# Patient Record
Sex: Male | Born: 1975 | Race: White | Hispanic: No | Marital: Married | Smoking: Never smoker
Health system: Southern US, Community
[De-identification: ages and names within clinical notes are randomized; demographics above are authoritative.]

## PROBLEM LIST (undated history)

## (undated) DIAGNOSIS — T7840XA Allergy, unspecified, initial encounter: Secondary | ICD-10-CM

## (undated) DIAGNOSIS — Z9889 Other specified postprocedural states: Secondary | ICD-10-CM

## (undated) DIAGNOSIS — K219 Gastro-esophageal reflux disease without esophagitis: Secondary | ICD-10-CM

## (undated) DIAGNOSIS — S43439A Superior glenoid labrum lesion of unspecified shoulder, initial encounter: Secondary | ICD-10-CM

## (undated) DIAGNOSIS — K2270 Barrett's esophagus without dysplasia: Secondary | ICD-10-CM

## (undated) DIAGNOSIS — R112 Nausea with vomiting, unspecified: Secondary | ICD-10-CM

## (undated) HISTORY — DX: Allergy, unspecified, initial encounter: T78.40XA

## (undated) HISTORY — DX: Barrett's esophagus without dysplasia: K22.70

## (undated) HISTORY — PX: KNEE ARTHROSCOPY: SUR90

---
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
## 2004-11-25 ENCOUNTER — Emergency Department (HOSPITAL_COMMUNITY): Admission: EM | Admit: 2004-11-25 | Discharge: 2004-11-25 | Payer: Self-pay | Admitting: *Deleted

## 2009-02-10 ENCOUNTER — Emergency Department (HOSPITAL_COMMUNITY): Admission: EM | Admit: 2009-02-10 | Discharge: 2009-02-11 | Payer: Self-pay | Admitting: Emergency Medicine

## 2010-04-02 ENCOUNTER — Ambulatory Visit (HOSPITAL_BASED_OUTPATIENT_CLINIC_OR_DEPARTMENT_OTHER): Admission: RE | Admit: 2010-04-02 | Discharge: 2010-04-02 | Payer: Self-pay | Admitting: Specialist

## 2011-01-14 LAB — POCT HEMOGLOBIN-HEMACUE: Hemoglobin: 15.6 g/dL (ref 13.0–17.0)

## 2012-02-11 ENCOUNTER — Other Ambulatory Visit: Payer: Self-pay | Admitting: Otolaryngology

## 2012-03-04 ENCOUNTER — Encounter (HOSPITAL_BASED_OUTPATIENT_CLINIC_OR_DEPARTMENT_OTHER): Payer: Self-pay | Admitting: *Deleted

## 2012-03-05 ENCOUNTER — Encounter (HOSPITAL_BASED_OUTPATIENT_CLINIC_OR_DEPARTMENT_OTHER): Payer: Self-pay | Admitting: *Deleted

## 2012-03-05 NOTE — Progress Notes (Signed)
To [facility] at 1100- Hg on arrival,NPO after mn-reviewed [facility] guidelines.

## 2012-03-09 ENCOUNTER — Encounter (HOSPITAL_BASED_OUTPATIENT_CLINIC_OR_DEPARTMENT_OTHER): Payer: Self-pay

## 2012-03-09 ENCOUNTER — Ambulatory Visit (HOSPITAL_BASED_OUTPATIENT_CLINIC_OR_DEPARTMENT_OTHER): Payer: BC Managed Care – PPO | Admitting: Anesthesiology

## 2012-03-09 ENCOUNTER — Encounter (HOSPITAL_BASED_OUTPATIENT_CLINIC_OR_DEPARTMENT_OTHER): Payer: Self-pay | Admitting: Anesthesiology

## 2012-03-09 ENCOUNTER — Encounter (HOSPITAL_BASED_OUTPATIENT_CLINIC_OR_DEPARTMENT_OTHER)
Admission: RE | Disposition: A | Discharge status: Home / Self Care | Payer: Self-pay | Source: Ambulatory Visit | Attending: Specialist

## 2012-03-09 ENCOUNTER — Ambulatory Visit (HOSPITAL_BASED_OUTPATIENT_CLINIC_OR_DEPARTMENT_OTHER)
Admission: RE | Admit: 2012-03-09 | Discharge: 2012-03-09 | Disposition: A | Discharge status: Home / Self Care | Payer: BC Managed Care – PPO | Source: Ambulatory Visit | Attending: Specialist | Admitting: Specialist

## 2012-03-09 DIAGNOSIS — S43429A Sprain of unspecified rotator cuff capsule, initial encounter: Secondary | ICD-10-CM | POA: Insufficient documentation

## 2012-03-09 DIAGNOSIS — K219 Gastro-esophageal reflux disease without esophagitis: Secondary | ICD-10-CM | POA: Insufficient documentation

## 2012-03-09 DIAGNOSIS — M25819 Other specified joint disorders, unspecified shoulder: Secondary | ICD-10-CM | POA: Insufficient documentation

## 2012-03-09 DIAGNOSIS — M75100 Unspecified rotator cuff tear or rupture of unspecified shoulder, not specified as traumatic: Secondary | ICD-10-CM

## 2012-03-09 DIAGNOSIS — X58XXXA Exposure to other specified factors, initial encounter: Secondary | ICD-10-CM | POA: Insufficient documentation

## 2012-03-09 DIAGNOSIS — M24119 Other articular cartilage disorders, unspecified shoulder: Secondary | ICD-10-CM | POA: Insufficient documentation

## 2012-03-09 HISTORY — PX: SHOULDER ARTHROSCOPY: SHX128

## 2012-03-09 HISTORY — DX: Gastro-esophageal reflux disease without esophagitis: K21.9

## 2012-03-09 HISTORY — DX: Nausea with vomiting, unspecified: R11.2

## 2012-03-09 HISTORY — DX: Other specified postprocedural states: Z98.890

## 2012-03-09 HISTORY — DX: Superior glenoid labrum lesion of unspecified shoulder, initial encounter: S43.439A

## 2012-03-09 SURGERY — ARTHROSCOPY, SHOULDER
Anesthesia: General | Site: Shoulder | Laterality: Left | Wound class: Clean

## 2012-03-09 MED ORDER — PROMETHAZINE HCL 25 MG/ML IJ SOLN: 6.2500 mg | INTRAMUSCULAR | Status: DC | PRN

## 2012-03-09 MED ORDER — GLYCOPYRROLATE 0.2 MG/ML IJ SOLN
INTRAMUSCULAR | Status: DC | PRN
  Administered 2012-03-09: 0.4 mg via INTRAVENOUS

## 2012-03-09 MED ORDER — LACTATED RINGERS IV SOLN: INTRAVENOUS | Status: DC

## 2012-03-09 MED ORDER — HYDROCODONE-ACETAMINOPHEN 7.5-325 MG PO TABS: 2.0000 | ORAL_TABLET | ORAL | Status: AC | PRN

## 2012-03-09 MED ORDER — BUPIVACAINE-EPINEPHRINE 0.5% -1:200000 IJ SOLN
INTRAMUSCULAR | Status: DC | PRN
  Administered 2012-03-09: 2 mL

## 2012-03-09 MED ORDER — SCOPOLAMINE 1 MG/3DAYS TD PT72
1.0000 | MEDICATED_PATCH | TRANSDERMAL | Status: DC
  Administered 2012-03-09: 1.5 mg via TRANSDERMAL

## 2012-03-09 MED ORDER — ACETAMINOPHEN 10 MG/ML IV SOLN
INTRAVENOUS | Status: DC | PRN
  Administered 2012-03-09: 1000 mg via INTRAVENOUS

## 2012-03-09 MED ORDER — CHLORHEXIDINE GLUCONATE 4 % EX LIQD: 60.0000 mL | Freq: Once | CUTANEOUS | Status: DC

## 2012-03-09 MED ORDER — SODIUM CHLORIDE 0.9 % IR SOLN
Status: DC | PRN
  Administered 2012-03-09: 6000 mL

## 2012-03-09 MED ORDER — PROPOFOL 10 MG/ML IV EMUL
INTRAVENOUS | Status: DC | PRN
  Administered 2012-03-09: 200 mg via INTRAVENOUS

## 2012-03-09 MED ORDER — DEXAMETHASONE SODIUM PHOSPHATE 4 MG/ML IJ SOLN
INTRAMUSCULAR | Status: DC | PRN
  Administered 2012-03-09: 8 mg via INTRAVENOUS

## 2012-03-09 MED ORDER — METHOCARBAMOL 500 MG PO TABS: 500.0000 mg | ORAL_TABLET | Freq: Four times a day (QID) | ORAL | Status: AC

## 2012-03-09 MED ORDER — VANCOMYCIN HCL IN DEXTROSE 1-5 GM/200ML-% IV SOLN
1000.0000 mg | INTRAVENOUS | Status: AC
  Administered 2012-03-09: 1000 mg via INTRAVENOUS

## 2012-03-09 MED ORDER — FENTANYL CITRATE 0.05 MG/ML IJ SOLN
INTRAMUSCULAR | Status: DC | PRN
  Administered 2012-03-09: 25 ug via INTRAVENOUS
  Administered 2012-03-09 (×2): 50 ug via INTRAVENOUS
  Administered 2012-03-09: 25 ug via INTRAVENOUS
  Administered 2012-03-09: 50 ug via INTRAVENOUS

## 2012-03-09 MED ORDER — LIDOCAINE HCL (CARDIAC) 20 MG/ML IV SOLN
INTRAVENOUS | Status: DC | PRN
  Administered 2012-03-09: 80 mg via INTRAVENOUS

## 2012-03-09 MED ORDER — MIDAZOLAM HCL 5 MG/5ML IJ SOLN
INTRAMUSCULAR | Status: DC | PRN
  Administered 2012-03-09: 2 mg via INTRAVENOUS

## 2012-03-09 MED ORDER — ROCURONIUM BROMIDE 100 MG/10ML IV SOLN
INTRAVENOUS | Status: DC | PRN
  Administered 2012-03-09: 20 mg via INTRAVENOUS
  Administered 2012-03-09: 5 mg via INTRAVENOUS
  Administered 2012-03-09: 20 mg via INTRAVENOUS

## 2012-03-09 MED ORDER — CIPROFLOXACIN HCL 500 MG PO TABS: 500.0000 mg | ORAL_TABLET | Freq: Every day | ORAL | Status: AC

## 2012-03-09 MED ORDER — HYDROMORPHONE HCL PF 1 MG/ML IJ SOLN: 0.2500 mg | INTRAMUSCULAR | Status: DC | PRN

## 2012-03-09 MED ORDER — NEOSTIGMINE METHYLSULFATE 1 MG/ML IJ SOLN
INTRAMUSCULAR | Status: DC | PRN
  Administered 2012-03-09: 3 mg via INTRAVENOUS

## 2012-03-09 MED ORDER — KETOROLAC TROMETHAMINE 30 MG/ML IJ SOLN: 15.0000 mg | Freq: Once | INTRAMUSCULAR | Status: DC | PRN

## 2012-03-09 MED ORDER — LACTATED RINGERS IV SOLN
INTRAVENOUS | Status: DC
  Administered 2012-03-09 (×3): via INTRAVENOUS

## 2012-03-09 MED ORDER — ONDANSETRON HCL 4 MG/2ML IJ SOLN
INTRAMUSCULAR | Status: DC | PRN
  Administered 2012-03-09: 4 mg via INTRAVENOUS

## 2012-03-09 MED ORDER — EPINEPHRINE HCL 1 MG/ML IJ SOLN
INTRAMUSCULAR | Status: DC | PRN
  Administered 2012-03-09: 2 mg

## 2012-03-09 MED ORDER — SUCCINYLCHOLINE CHLORIDE 20 MG/ML IJ SOLN
INTRAMUSCULAR | Status: DC | PRN
  Administered 2012-03-09: 100 mg via INTRAVENOUS

## 2012-03-09 MED ORDER — CIPROFLOXACIN IN D5W 400 MG/200ML IV SOLN
400.0000 mg | INTRAVENOUS | Status: AC
  Administered 2012-03-09: 400 mg via INTRAVENOUS

## 2012-03-09 SURGICAL SUPPLY — 58 items
0 VICRYL ON OS-2 NEEDLE ×1 IMPLANT
APL SKNCLS STERI-STRIP NONHPOA (GAUZE/BANDAGES/DRESSINGS)
BENZOIN TINCTURE PRP APPL 2/3 (GAUZE/BANDAGES/DRESSINGS) IMPLANT
BLADE 4.2CUDA (BLADE) ×2 IMPLANT
BLADE CUDA SHAVER 3.5 (BLADE) ×2 IMPLANT
BLADE SURG 11 STRL SS (BLADE) ×2 IMPLANT
BNDG COHESIVE 4X5 TAN NS LF (GAUZE/BANDAGES/DRESSINGS) ×2 IMPLANT
CANISTER SUCT LVC 12 LTR MEDI- (MISCELLANEOUS) ×2 IMPLANT
CANNULA ACUFLEX KIT 5X76 (CANNULA) ×2 IMPLANT
CANNULA SHOULDER 7CM (CANNULA) ×2 IMPLANT
CLOTH BEACON ORANGE TIMEOUT ST (SAFETY) ×2 IMPLANT
DRAPE LG THREE QUARTER DISP (DRAPES) ×1 IMPLANT
DRAPE STERI 35X30 U-POUCH (DRAPES) ×2 IMPLANT
DRAPE U 20/CS (DRAPES) ×4 IMPLANT
DURAPREP 26ML APPLICATOR (WOUND CARE) ×2 IMPLANT
ELECT MENISCUS 165MM 90D (ELECTRODE) IMPLANT
ELECT NDL TIP 2.8 STRL (NEEDLE) ×1 IMPLANT
ELECT NEEDLE TIP 2.8 STRL (NEEDLE) ×2 IMPLANT
ELECT REM PT RETURN 9FT ADLT (ELECTROSURGICAL) ×2
ELECTRODE REM PT RTRN 9FT ADLT (ELECTROSURGICAL) ×1 IMPLANT
GLOVE BIO SURGEON STRL SZ 6.5 (GLOVE) ×3 IMPLANT
GLOVE BIOGEL PI IND STRL 6.5 (GLOVE) IMPLANT
GLOVE BIOGEL PI INDICATOR 6.5 (GLOVE) ×2
GLOVE ECLIPSE 6.5 STRL STRAW (GLOVE) ×1 IMPLANT
GLOVE INDICATOR 7.0 STRL GRN (GLOVE) ×2 IMPLANT
GLOVE SURG SS PI 8.0 STRL IVOR (GLOVE) ×2 IMPLANT
GOWN STRL NON-REIN LRG LVL3 (GOWN DISPOSABLE) ×3 IMPLANT
NDL FILTER BLUNT 18X1 1/2 (NEEDLE) ×1 IMPLANT
NDL SAFETY ECLIPSE 18X1.5 (NEEDLE) ×1 IMPLANT
NDL SPNL 18GX3.5 QUINCKE PK (NEEDLE) ×1 IMPLANT
NEEDLE FILTER BLUNT 18X 1/2SAF (NEEDLE) ×1
NEEDLE FILTER BLUNT 18X1 1/2 (NEEDLE) ×1 IMPLANT
NEEDLE HYPO 18GX1.5 SHARP (NEEDLE) ×2
NEEDLE SPNL 18GX3.5 QUINCKE PK (NEEDLE) ×2 IMPLANT
PACK BASIN DAY SURGERY FS (CUSTOM PROCEDURE TRAY) ×2 IMPLANT
PACK SHOULDER CUSTOM OPM052 (CUSTOM PROCEDURE TRAY) ×2 IMPLANT
SET ARTHROSCOPY TUBING (MISCELLANEOUS) ×4
SET ARTHROSCOPY TUBING LN (MISCELLANEOUS) ×1 IMPLANT
SLING ARM FOAM STRAP LRG (SOFTGOODS) IMPLANT
SLING ULTRA II LARGE (SOFTGOODS) IMPLANT
STRIP CLOSURE SKIN 1/2X4 (GAUZE/BANDAGES/DRESSINGS) ×1 IMPLANT
SUT BONE WAX W31G (SUTURE) IMPLANT
SUT ETHIBOND 1 OS 2 18 CR3 (SUTURE) ×1 IMPLANT
SUT ETHIBOND 2 OS 4 DA (SUTURE) ×1 IMPLANT
SUT ETHILON 4 0 PS 2 18 (SUTURE) ×2 IMPLANT
SUT MON AB 4-0 PC3 18 (SUTURE) IMPLANT
SUT PDS AB 0 CT 36 (SUTURE) IMPLANT
SUT PROLENE 3 0 PS 2 (SUTURE) ×1 IMPLANT
SUT VIC AB 1 CT1 36 (SUTURE) IMPLANT
SUT VIC AB 1-0 CT2 27 (SUTURE) ×1 IMPLANT
SUT VIC AB 2-0 CT2 27 (SUTURE) ×2 IMPLANT
SUT VICRYL 0 UR6 27IN ABS (SUTURE) ×4 IMPLANT
SUT VICRYL 0-0 OS 2 NEEDLE (SUTURE) ×1 IMPLANT
SYRINGE 10CC LL (SYRINGE) ×2 IMPLANT
TAPE HYPAFIX 6X30 (GAUZE/BANDAGES/DRESSINGS) ×2 IMPLANT
TOWEL OR 17X24 6PK STRL BLUE (TOWEL DISPOSABLE) ×4 IMPLANT
WAND 90 DEG TURBOVAC W/CORD (SURGICAL WAND) ×1 IMPLANT
WATER STERILE IRR 500ML POUR (IV SOLUTION) ×2 IMPLANT

## 2012-03-09 NOTE — Anesthesia Preprocedure Evaluation (Signed)
Anesthesia Evaluation  Patient identified by MRN, date of birth, ID band Patient awake    Reviewed: Allergy & Precautions, H&P , NPO status , Patient's Chart, lab work & pertinent test results  History of Anesthesia Complications (+) PONV  Airway Mallampati: II TM Distance: <3 FB Neck ROM: Full    Dental No notable dental hx.    Pulmonary neg pulmonary ROS,  breath sounds clear to auscultation  Pulmonary exam normal       Cardiovascular negative cardio ROS  Rhythm:Regular Rate:Normal     Neuro/Psych negative neurological ROS  negative psych ROS   GI/Hepatic Neg liver ROS, Medicated,occ gerd   Endo/Other  negative endocrine ROS  Renal/GU negative Renal ROS  negative genitourinary   Musculoskeletal negative musculoskeletal ROS (+)   Abdominal   Peds negative pediatric ROS (+)  Hematology negative hematology ROS (+)   Anesthesia Other Findings   Reproductive/Obstetrics negative OB ROS                           Anesthesia Physical Anesthesia Plan  ASA: I  Anesthesia Plan: General   Post-op Pain Management:    Induction: Intravenous  Airway Management Planned: Oral ETT  Additional Equipment:   Intra-op Plan:   Post-operative Plan: Extubation in OR  Informed Consent: I have reviewed the patients History and Physical, chart, labs and discussed the procedure including the risks, benefits and alternatives for the proposed anesthesia with the patient or authorized representative who has indicated his/her understanding and acceptance.   Dental advisory given  Plan Discussed with: CRNA  Anesthesia Plan Comments:         Anesthesia Quick Evaluation

## 2012-03-09 NOTE — Discharge Instructions (Signed)
SHOULDER ARTHROSCOPY POSTOPERATIVE INSTRUCTIONS FOR DR. Shelle Iron  1.  Ice pack on shoulder 3-4 times per day.  2.  May remove bandages in 72 hours and apply band-aids to sutures.  3,  May get out of sling in AM and start gentle pendulum exercises.  You are encouraged       to move the elbow, wrist and hand.  4.  Elevate operative shoulder and elbow on pillow.  5.  Exercise your fingers to help reduce swelling.  6.  Report to your doctor should any of the following situations occur:   -Swelling of your fingers.  -Inability to wiggle your fingers.  -Coldness, turning pale or blueness of your fingers.  -Loss of sensation, numbness or tingling of your fingers.  -Unusual small or odor from under dressing.  -Excessive bleeding or drainage from the surgical site(s).  -Severe pain which is not relieved by the pain medication your doctor prescribed                for you.  7.  Call the office to schedule and appointment for *** .  Patient Signature:  ________________________________________________________  Nurse's Signature:  ________________________________________________________   Post Anesthesia Home Care Instructions  Activity: Get plenty of rest for the remainder of the day. A responsible adult should stay with you for 24 hours following the procedure.  For the next 24 hours, DO NOT: -Drive a car -Advertising copywriter -Drink alcoholic beverages -Take any medication unless instructed by your physician -Make any legal decisions or sign important papers.  Meals: Start with liquid foods such as gelatin or soup. Progress to regular foods as tolerated. Avoid greasy, spicy, heavy foods. If nausea and/or vomiting occur, drink only clear liquids until the nausea and/or vomiting subsides. Call your physician if vomiting continues.  Special Instructions/Symptoms: Your throat may feel dry or sore from the anesthesia or the breathing tube placed in your throat during surgery. If this causes  discomfort, gargle with warm salt water. The discomfort should disappear within 24 hours.

## 2012-03-09 NOTE — Transfer of Care (Signed)
Immediate Anesthesia Transfer of Care Note  Patient: Anthony Benson  Procedure(s) Performed: Procedure(s) (LRB): ARTHROSCOPY SHOULDER (Left)  Patient Location: PACU  Anesthesia Type: General  Level of Consciousness: awake, oriented, sedated and patient cooperative  Airway & Oxygen Therapy: Patient Spontanous Breathing and Patient connected to face mask oxygen  Post-op Assessment: Report given to PACU RN and Post -op Vital signs reviewed and stable  Post vital signs: Reviewed and stable  Complications: No apparent anesthesia complications

## 2012-03-09 NOTE — Anesthesia Procedure Notes (Signed)
Procedure Name: Intubation Date/Time: 03/09/2012 1:35 PM Performed by: Renella Cunas D Pre-anesthesia Checklist: Patient identified, Emergency Drugs available, Suction available and Patient being monitored Patient Re-evaluated:Patient Re-evaluated prior to inductionOxygen Delivery Method: Circle System Utilized Preoxygenation: Pre-oxygenation with 100% oxygen Intubation Type: IV induction Ventilation: Mask ventilation without difficulty Laryngoscope Size: Mac and 3 Grade View: Grade I Tube type: Oral Tube size: 8.0 mm Number of attempts: 1 Airway Equipment and Method: stylet,  oral airway and LTA kit utilized Placement Confirmation: ETT inserted through vocal cords under direct vision,  positive ETCO2 and breath sounds checked- equal and bilateral Secured at: 22 cm Tube secured with: Tape Dental Injury: Teeth and Oropharynx as per pre-operative assessment

## 2012-03-09 NOTE — Brief Op Note (Signed)
03/09/2012  2:50 PM  PATIENT:  Anthony Benson  36 y.o. male  PRE-OPERATIVE DIAGNOSIS:  left shoulder labrial tear  POST-OPERATIVE DIAGNOSIS:  left shoulder labrial tear  PROCEDURE:  Procedure(s) (LRB): left RCR mini ARTHROSCOPY SHOULDER (Left)  SURGEON:  Surgeon(s) and Role:    * Javier Docker, MD - Primary  PHYSICIAN ASSISTANT:   ASSISTANTS: strader   ANESTHESIA:   general  EBL:  Total I/O In: 200 [I.V.:200] Out: -   BLOOD ADMINISTERED:none  DRAINS: none   LOCAL MEDICATIONS USED:  MARCAINE     SPECIMEN:  No Specimen  DISPOSITION OF SPECIMEN:  N/A  COUNTS:  YES  TOURNIQUET:  * No tourniquets in log *  DICTATION: .Other Dictation: Dictation Number [id number]  PLAN OF CARE: Discharge to home after PACU  PATIENT DISPOSITION:  PACU - hemodynamically stable.   Delay start of Pharmacological VTE agent (>24hrs) due to surgical blood loss or risk of bleeding: no

## 2012-03-09 NOTE — H&P (Signed)
Anthony Benson is an 36 y.o. male.   Chief Complaint: left shoulder pain  HPI: partial tear RC and labrum with impingement refractory.  Past Medical History  Diagnosis Date  . Labral tear of shoulder left shoulder  . GERD (gastroesophageal reflux disease)     OTC -prn  . PONV (postoperative nausea and vomiting)     mild    Past Surgical History  Procedure Date  . Ankle arthroscopy     lt knee x3-last one 2010  . Adenoidectomy 1980    History reviewed. No pertinent family history. Social History:  reports that he has never smoked. He does not have any smokeless tobacco history on file. He reports that he drinks alcohol. His drug history not on file.  Allergies:  Allergies  Allergen Reactions  . Penicillins Anaphylaxis  . Peanut-Containing Drug Products Swelling    [place] nuts    Medications Prior to Admission  Medication Sig Dispense Refill  . HYDROcodone-acetaminophen (NORCO) 5-325 MG per tablet Take 1 tablet by mouth every 6 (six) hours as needed.      Marland Kitchen ibuprofen (ADVIL,MOTRIN) 200 MG tablet Take 200 mg by mouth every 6 (six) hours as needed.      . Multiple Vitamin (MULTIVITAMIN) tablet Take 1 tablet by mouth daily.        Results for orders placed during the hospital encounter of 03/09/12 (from the past 48 hour(s))  POCT HEMOGLOBIN-HEMACUE     Status: Abnormal   Collection Time   03/09/12 12:07 PM      Component Value Range Comment   Hemoglobin 19.2 (*) 13.0 - 17.0 (g/dL)    No results found.  Review of Systems  Constitutional: Negative.   HENT: Negative.   Eyes: Negative.   Cardiovascular: Negative.   Musculoskeletal: Positive for joint pain.  Skin: Negative.   All other systems reviewed and are negative.    Blood pressure 133/88, pulse 99, temperature 97.7 F (36.5 C), temperature source Oral, resp. rate 18, height 5\' 7"  (1.702 m), weight 83.915 kg (185 lb), SpO2 97.00%. Physical Exam  Vitals reviewed. Constitutional: He appears well-developed.    HENT:  Head: Normocephalic.  Eyes: Pupils are equal, round, and reactive to light.  Neck: Normal range of motion.  Cardiovascular: Normal rate.   Respiratory: Effort normal.  GI: Soft.  Musculoskeletal:       Positive impingement left. NT left AC NVI.   Neurological: He is alert.  Skin: Skin is warm.  Psychiatric: He has a normal mood and affect.     Assessment/Plan Left shoulder impingement RCT labral tear. Plan LSA SAD labral debr. Possible RCR. Risks benefits discussed.  Marie Borowski C 03/09/2012, 12:44 PM

## 2012-03-10 ENCOUNTER — Encounter (HOSPITAL_BASED_OUTPATIENT_CLINIC_OR_DEPARTMENT_OTHER): Payer: Self-pay | Admitting: Specialist

## 2012-03-10 NOTE — Op Note (Signed)
Anthony Benson, Anthony Benson                 ACCOUNT NO.:  [id number]  MEDICAL RECORD NO.:  [id number]  LOCATION:                               FACILITY:  [facility]  PHYSICIAN:  Jene Every, M.D.    DATE OF BIRTH:  11-Jul-1976  DATE OF PROCEDURE:  03/09/2012 DATE OF DISCHARGE:                              OPERATIVE REPORT   PREOPERATIVE DIAGNOSES: 1. Impingement syndrome. 2. Labral tear of the left shoulder.  POSTOPERATIVE DIAGNOSES: 1. Impingement syndrome. 2. Labral tear of the left shoulder.  PROCEDURE PERFORMED: 1. Exam under anesthesia. 2. Left shoulder arthroscopy and debridement of superior labrum. 3. Subacromial decompression. 4. Acromioplasty. 5. Mini open rotator cuff repair utilizing the suture tenodesis.  ANESTHESIA:  General.  ASSISTANT:  Roma Schanz, P.A.  BRIEF HISTORY:  This is a 36 year old with refractory shoulder pain, temporary relief from subacromial corticosteroid injections, physical therapy.  He had 2 injuries to his left shoulder, second generating a tear within the intrasubstance of the rotator cuff.  This was 40%, possibly more.  He had persistent refractory symptoms and a possible superior labral tear.  He had no history of dislocation or symptoms consistent with labral tear or displaced labral tear.  He has impingement pain that was temporally relieved from corticosteroid injection.  We therefore discussed eval under anesthesia, arthroscopy evaluation of rotator cuff, subacromial decompression, possible mini open rotator cuff repair.  Risks and benefits discussed including bleeding, infection, damage to vascular structures, no change in symptoms, worsening symptoms, need for repeat debridement, DVT, PE, anesthetic complications, etc.  TECHNIQUE:  With the patient in supine position, after induction of adequate general anesthesia and preoperative antibiotics, the patient was examined under anesthesia.  He had full range in comparison to  the contralateral side.  The left shoulder and upper extremity were then prepped and draped in usual sterile fashion.  Surgical marker utilized to delineate the acromion, coracoid, and AC joint.  Standard posterolateral portal was utilized with incision through the skin only with a 11 blade with the arm in the 70/30 position with gentle traction applied, advanced a cannula in the glenohumeral joint penetrating atraumatically.  Examination with the intraoperative pressure at 65 mmHg.  Noted was a tear from the articular side near the anterior aspect of the supraspinatus and biceps tendon.  The biceps tendon appeared to be intact.  There was fraying of the labrum superiorly and a Buford type complex medially.  We have a smooth area of the labrum from the glenoid. I fashioned anterior portal for further inspection by localizing it with 18-gauge needle just beneath the biceps tendon, between the coracoid and the anterolateral aspect of the acromion, made a small incision with 11 blade through the skin only.  I placed a cannula colinear with the 18- gauge needle, penetrating the capsule.  I introduced a probe and probed the labrum extensively with some degenerative fraying and tearing superiorly.  This shaved this to a stable base.  By extensively probing, this anterior labral defect did appear consistent with a Buford type complex with smooth edges and not the typical torn edge consistent with fascial labrum.  The articular side tear in the supraspinatus that was  not evident on the MRI.  I then placed an 18-gauge needle from the subacromial space, and through this tear in the tendon through the PDS suture through this for localization.  Just prior to that, we had a tubing in the arthroscopic pump that dysfunctioned and we had to terminate the flow and replaced the tubing.  Once the tubing was replaced and we inflated the joint, I then redirected the camera in the subacromial space and made an  anterolateral portal with incision through the skin only we triangulated the subacromial space.  Hypertrophic exuberant bursa was noted.  As we just introduced the shaver, we had another tubing malfunctioned and the arthroscopic pump did not function just prior to this and inspection revealed through the area of the PDS, there was hyperemic and fraying of the tendon, but this was a pathologic and most likely represented greater than 50% tearing of the tendon.  I therefore decided to proceed with a mini-open rotator cuff repair.  I made a small incision through the anterolateral aspect of the acromion. After infiltration with Marcaine with epinephrine, approximately 2 cm in length, subcutaneous tissue was dissected by cautery to achieve hemostasis.  The raphe between the anterolateral heads was identified and subperiosteally elevated from the anterolateral and anteromedial aspect of the acromion preserving the deltoid attachment.  I released the CA ligament.  I excised that.  Identified the PDS suture on top of the small longitudinal tear in the supraspinatus lateral to the bicipital groove.  The zone of hyperemia underneath the anterolateral aspect the acromion with the arm in abduction.  This was consistent with pathology seen on the MRI.  I debrided this tear with the aid of a 15 blade in the rongeur to good bleeding tissue.  I repaired side-to-side with 2 sutures of 1 Vicryl interrupted figure-of-eight suture and 1 Ethibond suture.  Side-to-side repair, no anchor was required.  I used a 3-mm Kerrison removing small spur off the anterolateral aspect of the acromion.  I performed a full bursectomy in subacromial space as well. The remainder of the tendon was viable and intact without evidence of tear.  Copiously irrigated the wound and then repaired the raphe with 1 Vicryl interrupted figure-of-8 sutures, subcu with 2-0 Vicryl simple sutures.  Skin was reapproximated with 4-0 subcuticular  Prolene.  Wound reinforced with Steri-Strips.  Sterile dressing applied.  Placed in an abduction pillow, extubated without difficulty, and transported to the recovery room in satisfactory condition.  The patient tolerated procedure well.  No complications.  Assistant, Roma Schanz.  No blood loss.     Jene Every, M.D.     Cordelia Pen  D:  03/09/2012  T:  03/10/2012  Job:  [id number]

## 2012-03-10 NOTE — Anesthesia Postprocedure Evaluation (Signed)
Anesthesia Post Note  Patient: Anthony Benson  Procedure(s) Performed: Procedure(s) (LRB): ARTHROSCOPY SHOULDER (Left)  Anesthesia type: General  Patient location: PACU  Post pain: Pain level controlled  Post assessment: Post-op Vital signs reviewed  Last Vitals:  Filed Vitals:   03/09/12 1703  BP: 117/76  Pulse: 95  Temp: 36.3 C  Resp: 20    Post vital signs: Reviewed  Level of consciousness: sedated  Complications: No apparent anesthesia complications

## 2012-03-13 ENCOUNTER — Encounter (HOSPITAL_BASED_OUTPATIENT_CLINIC_OR_DEPARTMENT_OTHER): Payer: Self-pay

## 2015-12-20 ENCOUNTER — Encounter: Payer: Self-pay | Admitting: Internal Medicine

## 2015-12-20 ENCOUNTER — Emergency Department (HOSPITAL_COMMUNITY)
Admission: EM | Admit: 2015-12-20 | Discharge: 2015-12-20 | Disposition: A | Discharge status: Home / Self Care | Payer: BLUE CROSS/BLUE SHIELD | Attending: Emergency Medicine | Admitting: Emergency Medicine

## 2015-12-20 ENCOUNTER — Encounter (HOSPITAL_COMMUNITY): Payer: Self-pay

## 2015-12-20 ENCOUNTER — Emergency Department (HOSPITAL_COMMUNITY): Payer: BLUE CROSS/BLUE SHIELD

## 2015-12-20 DIAGNOSIS — R1310 Dysphagia, unspecified: Secondary | ICD-10-CM | POA: Diagnosis present

## 2015-12-20 DIAGNOSIS — K224 Dyskinesia of esophagus: Secondary | ICD-10-CM | POA: Diagnosis not present

## 2015-12-20 DIAGNOSIS — Z880 Allergy status to penicillin: Secondary | ICD-10-CM | POA: Diagnosis not present

## 2015-12-20 DIAGNOSIS — Z7951 Long term (current) use of inhaled steroids: Secondary | ICD-10-CM | POA: Insufficient documentation

## 2015-12-20 DIAGNOSIS — K219 Gastro-esophageal reflux disease without esophagitis: Secondary | ICD-10-CM | POA: Insufficient documentation

## 2015-12-20 DIAGNOSIS — Z87828 Personal history of other (healed) physical injury and trauma: Secondary | ICD-10-CM | POA: Diagnosis not present

## 2015-12-20 MED ORDER — DIAZEPAM 5 MG/ML IJ SOLN
5.0000 mg | Freq: Once | INTRAMUSCULAR | Status: AC
  Administered 2015-12-20: 5 mg via INTRAVENOUS
  Filled 2015-12-20: qty 2

## 2015-12-20 MED ORDER — GLUCAGON HCL RDNA (DIAGNOSTIC) 1 MG IJ SOLR
1.0000 mg | Freq: Once | INTRAMUSCULAR | Status: AC
  Administered 2015-12-20: 1 mg via INTRAVENOUS
  Filled 2015-12-20: qty 1

## 2015-12-20 MED ORDER — SODIUM CHLORIDE 0.9 % IV BOLUS (SEPSIS)
500.0000 mL | Freq: Once | INTRAVENOUS | Status: AC
  Administered 2015-12-20: 500 mL via INTRAVENOUS

## 2015-12-20 MED ORDER — DIAZEPAM 5 MG PO TABS: 5.0000 mg | ORAL_TABLET | Freq: Three times a day (TID) | ORAL | Status: DC | PRN

## 2015-12-20 NOTE — ED Notes (Addendum)
Pt c/o dysphagia starting last night after eating a chicken and rice bowl.  Pt reports that he is unable to even drink liquids.  Previous history of same w/ relief from OTC medications.  Pt reports going to [facility] and was given a GI cocktail w/o relief.  Pt able to easily speak in full sentences and maintain saliva.

## 2015-12-20 NOTE — Discharge Instructions (Signed)
Take the prescribed medication as directed. Follow-up with GI. Return to the ED for new or worsening symptoms-- recurrent difficulty swallowing or handling secretions, difficulty breathing, etc.   Food Choices for Gastroesophageal Reflux Disease, Adult When you have gastroesophageal reflux disease (GERD), the foods you eat and your eating habits are very important. Choosing the right foods can help ease the discomfort of GERD. WHAT GENERAL GUIDELINES DO I NEED TO FOLLOW?  Choose fruits, vegetables, whole grains, low-fat dairy products, and low-fat meat, fish, and poultry.  Limit fats such as oils, salad dressings, butter, nuts, and avocado.  Keep a food diary to identify foods that cause symptoms.  Avoid foods that cause reflux. These may be different for different people.  Eat frequent small meals instead of three large meals each day.  Eat your meals slowly, in a relaxed setting.  Limit fried foods.  Cook foods using methods other than frying.  Avoid drinking alcohol.  Avoid drinking large amounts of liquids with your meals.  Avoid bending over or lying down until 2-3 hours after eating. WHAT FOODS ARE NOT RECOMMENDED? The following are some foods and drinks that may worsen your symptoms: Vegetables Tomatoes. Tomato juice. Tomato and spaghetti sauce. Chili peppers. Onion and garlic. Horseradish. Fruits Oranges, grapefruit, and lemon (fruit and juice). Meats High-fat meats, fish, and poultry. This includes hot dogs, ribs, ham, sausage, salami, and bacon. Dairy Whole milk and chocolate milk. Sour cream. Cream. Butter. Ice cream. Cream cheese.  Beverages Coffee and tea, with or without caffeine. Carbonated beverages or energy drinks. Condiments Hot sauce. Barbecue sauce.  Sweets/Desserts Chocolate and cocoa. Donuts. Peppermint and spearmint. Fats and Oils High-fat foods, including [place] fries and potato chips. Other Vinegar. Strong spices, such as black pepper, white  pepper, red pepper, cayenne, curry powder, cloves, ginger, and chili powder. The items listed above may not be a complete list of foods and beverages to avoid. Contact your dietitian for more information.   This information is not intended to replace advice given to you by your health care provider. Make sure you discuss any questions you have with your health care provider.   Document Released: 10/14/2005 Document Revised: 11/04/2014 Document Reviewed: 08/18/2013 Elsevier Interactive Patient Education [employer].

## 2015-12-20 NOTE — ED Provider Notes (Signed)
CSN: [id number]     Arrival date & time 12/20/15  1338 History   First MD Initiated Contact with Patient 12/20/15 2009     Chief Complaint  Patient presents with  . Dysphagia     (Consider location/radiation/quality/duration/timing/severity/associated sxs/prior Treatment) The history is provided by the patient and medical records.   40 year old male with history of GERD, presenting to the ED for difficulty swallowing since last night. He states yesterday evening he ate a chicken and rice bowl but after he got finished eating he began having difficulty swallowing. He states he does not feel as if there is food stuck in his throat, rather it feels like a "gas bubble". He states throughout the day today he has tried to drink water he feels that it hit the bottom of his throat, gets stuck, and then he vomits. He states he has not attempted to eat solid food today. He states when this has happened in the past he was able to treat himself at home with over-the-counter medications. He states his symptoms never lasted for more than 30 minutes in the past. He was seen at urgent care earlier today and given a GI cocktail without improvement.  Patient has never seen GI in the past. Has never had an EGD. No history of esophageal strictures. States at baseline he does not have any difficulty swallowing. States he does have GERD, has had increased flares over the past few weeks, most recently when eating pizza.  VSS.  Past Medical History  Diagnosis Date  . Labral tear of shoulder left shoulder  . GERD (gastroesophageal reflux disease)     OTC -prn  . PONV (postoperative nausea and vomiting)     mild   Past Surgical History  Procedure Laterality Date  . Adenoidectomy  1980  . Shoulder arthroscopy  03/09/2012    Procedure: ARTHROSCOPY SHOULDER;  Surgeon: Johnn Hai, MD;  Location: [facility];  Service: Orthopedics;  Laterality: Left;  left shoulder scope exam under anes subarcomial  decompression and mini open rotator cuff repair   ower  . Knee arthroscopy Left     x 3   History reviewed. No pertinent family history. Social History  Substance Use Topics  . Smoking status: Never Smoker   . Smokeless tobacco: None  . Alcohol Use: Yes     Comment: occasional    Review of Systems  HENT: Positive for trouble swallowing.   All other systems reviewed and are negative.     Allergies  Penicillins and Peanut-containing drug products  Home Medications   Prior to Admission medications   Medication Sig Start Date End Date Taking? Authorizing Provider  fluticasone (FLONASE) 50 MCG/ACT nasal spray Place 1 spray into both nostrils daily.   Yes Historical Provider, MD   BP 108/76 mmHg  Pulse 93  Temp(Src) 98 F (36.7 C) (Oral)  Resp 17  SpO2 100%   Physical Exam  Constitutional: He is oriented to person, place, and time. He appears well-developed and well-nourished. No distress.  HENT:  Head: Normocephalic and atraumatic.  Mouth/Throat: Oropharynx is clear and moist.  Oropharynx clear, airway patent; no swelling noted; handling secretions well; normal phonation, no stridor  Eyes: Conjunctivae and EOM are normal. Pupils are equal, round, and reactive to light.  Neck: Normal range of motion. Neck supple.  Cardiovascular: Normal rate, regular rhythm and normal heart sounds.   Pulmonary/Chest: Effort normal and breath sounds normal. No respiratory distress. He has no wheezes. He has no  rhonchi.  Lungs overall clear  Abdominal: Soft. Bowel sounds are normal. There is no tenderness. There is no guarding.  Musculoskeletal: Normal range of motion. He exhibits no edema.  Neurological: He is alert and oriented to person, place, and time.  Skin: Skin is warm and dry. He is not diaphoretic.  Psychiatric: He has a normal mood and affect.  Nursing note and vitals reviewed.   ED Course  Procedures (including critical care time) Labs Review Labs Reviewed - No data to  display  Imaging Review Dg Neck Soft Tissue  12/20/2015  CLINICAL DATA:  Dysphasia for 2 days. EXAM: NECK SOFT TISSUES - 1+ VIEW COMPARISON:  None. FINDINGS: Two views study shows no abnormal prevertebral soft tissue swelling. There is no evidence for gas in the prevertebral soft tissues. Epiglottis and area epiglottic folds are normal. Visualized bony anatomy is unremarkable. IMPRESSION: Normal exam. Electronically Signed   By: Misty Stanley M.D.   On: 12/20/2015 21:00   Dg Chest 2 View  12/20/2015  CLINICAL DATA:  Dysphagia for 2 days EXAM: CHEST  2 VIEW COMPARISON:  11/25/2004 FINDINGS: The heart size and mediastinal contours are within normal limits. Both lungs are clear. The visualized skeletal structures are unremarkable. IMPRESSION: No active cardiopulmonary disease. Electronically Signed   By: Inez Catalina M.D.   On: 12/20/2015 21:01   I have personally reviewed and evaluated these images and lab results as part of my medical decision-making.   EKG Interpretation None      MDM   Final diagnoses:  Difficulty swallowing  Esophageal spasm   40 year old male here with difficulty swallowing for the past 24 hours. Patient is afebrile, nontoxic. He is handling his secretions well. His oropharynx is normal in appearance, no swelling or edema. Airways patent. Vital signs stable on room air. Lungs are clear. Speaking without difficulty, no stridor.  Given prolonged symptoms, will obtain plain films of the neck and chest to ensure no obstructive pathology. Patient will be given IV fluids, glucagon, and Valium.  Will monitor closely.  10:25 PM On re-check patient states he feels back to normal. He continues handling his secretions well. He has tolerated several cups of water without difficulty and states he is ready to eat. I reviewed his x-ray results with patient and wife, they knowledge understanding. Suspect patient with esophageal spasm. Will discharge home with Valium. Patient has already  scheduled outpatient follow-up with [facility] on April 18th.  Encouraged gentle diet for the next few days, progress back to normal.  Discussed foods to avoid which may exacerbate his GERD (tomatoes, lemons, oranges, chocolate, peppermint, etc).  Discussed plan with patient, he/she acknowledged understanding and agreed with plan of care.  Return precautions given for new or worsening symptoms.  Larene Pickett, PA-C 12/20/15 [id number]  Veryl Speak, MD 12/20/15 [phone]

## 2016-02-13 ENCOUNTER — Encounter: Payer: Self-pay | Admitting: Internal Medicine

## 2016-02-13 ENCOUNTER — Ambulatory Visit (INDEPENDENT_AMBULATORY_CARE_PROVIDER_SITE_OTHER): Payer: BLUE CROSS/BLUE SHIELD | Admitting: Internal Medicine

## 2016-02-13 VITALS — BP 102/76 | HR 88 | Ht 67.0 in | Wt 189.8 lb

## 2016-02-13 DIAGNOSIS — K219 Gastro-esophageal reflux disease without esophagitis: Secondary | ICD-10-CM

## 2016-02-13 DIAGNOSIS — R1310 Dysphagia, unspecified: Secondary | ICD-10-CM

## 2016-02-13 MED ORDER — OMEPRAZOLE 40 MG PO CPDR: 40.0000 mg | DELAYED_RELEASE_CAPSULE | Freq: Every day | ORAL | Status: DC

## 2016-02-13 NOTE — Patient Instructions (Signed)
We have sent the following medications to your pharmacy for you to pick up at your convenience:  Omeprazole  You have been scheduled for an endoscopy. Please follow written instructions given to you at your visit today. If you use inhalers (even only as needed), please bring them with you on the day of your procedure. Your physician has requested that you go to www.startemmi.com and enter the access code given to you at your visit today. This web site gives a general overview about your procedure. However, you should still follow specific instructions given to you by our office regarding your preparation for the procedure.   

## 2016-02-13 NOTE — Progress Notes (Signed)
HISTORY OF PRESENT ILLNESS:  Anthony Benson is a 40 y.o. male with no significant past medical history who presents today after referral from the [facility] emergency room, Dr. Stark Jock, regarding transient food impaction. The patient reports a several year history of significant intermittent solid food dysphagia proximal once or twice per year. In February he went to the emergency room after consuming chicken and rice with an obvious food impaction which subsequently resolved spontaneously. He does have indigestion heartburn approximate once or twice per week. On the medication. Weight has been stable. No other issues or complaints  REVIEW OF SYSTEMS:  All non-GI ROS negative except for sinus allergy, nose bleeds  Past Medical History  Diagnosis Date  . Labral tear of shoulder left shoulder  . GERD (gastroesophageal reflux disease)     OTC -prn  . PONV (postoperative nausea and vomiting)     mild    Past Surgical History  Procedure Laterality Date  . Adenoidectomy  1980  . Shoulder arthroscopy  03/09/2012    Procedure: ARTHROSCOPY SHOULDER;  Surgeon: Johnn Hai, MD;  Location: [facility];  Service: Orthopedics;  Laterality: Left;  left shoulder scope exam under anes subarcomial decompression and mini open rotator cuff repair   ower  . Knee arthroscopy Left     x 3    Social History Anthony Benson  reports that he has never smoked. He has never used smokeless tobacco. He reports that he does not drink alcohol or use illicit drugs.  family history includes Diabetes in his father.  Allergies  Allergen Reactions  . Penicillins Anaphylaxis    Has patient had a PCN reaction causing immediate rash, facial/tongue/throat swelling, SOB or lightheadedness with hypotension: Yes Has patient had a PCN reaction causing severe rash involving mucus membranes or skin necrosis: Yes Has patient had a PCN reaction that required hospitalization No Has patient had a PCN reaction  occurring within the last 10 years: No If all of the above answers are "NO", then may proceed with Cephalosporin use.   Marland Kitchen Peanut-Containing Drug Products Swelling    [place] nuts       PHYSICAL EXAMINATION: Vital signs: BP 102/76 mmHg  Pulse 88  Ht 5\' 7"  (1.702 m)  Wt 189 lb 12.8 oz (86.093 kg)  BMI 29.72 kg/m2  Constitutional: generally well-appearing, no acute distress Psychiatric: alert and oriented x3, cooperative Eyes: extraocular movements intact, anicteric, conjunctiva pink Mouth: oral pharynx moist, no lesions Neck: suppleThyromegaly Lymph: no supraclavicular lymphadenopathy Cardiovascular: heart regular rate and rhythm, no murmur Lungs: clear to auscultation bilaterally Abdomen: soft, nontender, nondistended, no obvious ascites, no peritoneal signs, normal bowel sounds, no organomegaly Rectal: Omitted Extremities: no clubbing cyanosis or lower extremity edema bilaterally Skin: no lesions on visible extremities Neuro: No focal deficits. Normal DTRs  ASSESSMENT:  #1. Intermittent solid food dysphagia with recent transient food impaction. Most likely secondary to peptic stricture or ring #2. GERD  PLAN:  #1. Reflux precautions #2. Prescribe omeprazole 40 mg daily #3. Schedule upper endoscopy with esophageal dilation.The nature of the procedure, as well as the risks, benefits, and alternatives were carefully and thoroughly reviewed with the patient. Ample time for discussion and questions allowed. The patient understood, was satisfied, and agreed to proceed.  A copy of this consultation note will be sent to Dr. Stark Jock

## 2016-03-01 ENCOUNTER — Encounter: Payer: Self-pay | Admitting: Internal Medicine

## 2016-03-15 ENCOUNTER — Ambulatory Visit (AMBULATORY_SURGERY_CENTER): Payer: BLUE CROSS/BLUE SHIELD | Admitting: Internal Medicine

## 2016-03-15 ENCOUNTER — Encounter: Payer: Self-pay | Admitting: Internal Medicine

## 2016-03-15 VITALS — BP 117/75 | HR 84 | Temp 99.3°F | Resp 14 | Ht 67.0 in | Wt 189.0 lb

## 2016-03-15 DIAGNOSIS — K2270 Barrett's esophagus without dysplasia: Secondary | ICD-10-CM

## 2016-03-15 DIAGNOSIS — R1310 Dysphagia, unspecified: Secondary | ICD-10-CM | POA: Diagnosis not present

## 2016-03-15 DIAGNOSIS — K219 Gastro-esophageal reflux disease without esophagitis: Secondary | ICD-10-CM

## 2016-03-15 DIAGNOSIS — K222 Esophageal obstruction: Secondary | ICD-10-CM | POA: Diagnosis not present

## 2016-03-15 MED ORDER — SODIUM CHLORIDE 0.9 % IV SOLN: 500.0000 mL | INTRAVENOUS | Status: DC

## 2016-03-15 NOTE — Patient Instructions (Addendum)
YOU HAD AN ENDOSCOPIC PROCEDURE TODAY AT [facility]:   Refer to the procedure report that was given to you for any specific questions about what was found during the examination.  If the procedure report does not answer your questions, please call your gastroenterologist to clarify.  If you requested that your care partner not be given the details of your procedure findings, then the procedure report has been included in a sealed envelope for you to review at your convenience later.  YOU SHOULD EXPECT: Some feelings of bloating in the abdomen. Passage of more gas than usual.  Walking can help get rid of the air that was put into your GI tract during the procedure and reduce the bloating. If you had a lower endoscopy (such as a colonoscopy or flexible sigmoidoscopy) you may notice spotting of blood in your stool or on the toilet paper. If you underwent a bowel prep for your procedure, you may not have a normal bowel movement for a few days.  Please Note:  You might notice some irritation and congestion in your nose or some drainage.  This is from the oxygen used during your procedure.  There is no need for concern and it should clear up in a day or so.  SYMPTOMS TO REPORT IMMEDIATELY:     Following upper endoscopy (EGD)  Vomiting of blood or coffee ground material  New chest pain or pain under the shoulder blades  Painful or persistently difficult swallowing  New shortness of breath  Fever of 100F or higher  Black, tarry-looking stools  For urgent or emergent issues, a gastroenterologist can be reached at any hour by calling [phone].   DIET: clear liquids unti 2:30 today ,then soft foods the rest of today ,back to usual dit tomorrow   ACTIVITY:  You should plan to take it easy for the rest of today and you should NOT DRIVE or use heavy machinery until tomorrow (because of the sedation medicines used during the test).    FOLLOW UP: Our staff will call the number  listed on your records the next business day following your procedure to check on you and address any questions or concerns that you may have regarding the information given to you following your procedure. If we do not reach you, we will leave a message.  However, if you are feeling well and you are not experiencing any problems, there is no need to return our call.  We will assume that you have returned to your regular daily activities without incident.  If any biopsies were taken you will be contacted by phone or by letter within the next 1-3 weeks.  Please call us at [phone] if you have not heard about the biopsies in 3 weeks.    SIGNATURES/CONFIDENTIALITY: You and/or your care partner have signed paperwork which will be entered into your electronic medical record.  These signatures attest to the fact that that the information above on your After Visit Summary has been reviewed and is understood.  Full responsibility of the confidentiality of this discharge information lies with you and/or your care-partner.   Follow dilatation diet given to you   Await biopsy results  Information on Barretts esophagus given to you today.  Continue Omeprazole daily (30 minutes before first meal of the day)

## 2016-03-15 NOTE — Op Note (Signed)
[place] Patient Name: Anthony Benson Procedure Date: 03/15/2016 9:55 AM MRN: [id number] Endoscopist: Docia Chuck. [name] , MD Age: 40 Referring MD:  Date of Birth: 1976/08/06 Gender: Male Procedure:                Upper GI endoscopy, with multiple biopsies and                            Maloney dilation of the esophagus Indications:              Dysphagia, Suspected esophageal reflux Medicines:                Monitored Anesthesia Care Procedure:                Pre-Anesthesia Assessment:                           - Prior to the procedure, a History and Physical                            was performed, and patient medications and                            allergies were reviewed. The patient's tolerance of                            previous anesthesia was also reviewed. The risks                            and benefits of the procedure and the sedation                            options and risks were discussed with the patient.                            All questions were answered, and informed consent                            was obtained. Prior Anticoagulants: The patient has                            taken no previous anticoagulant or antiplatelet                            agents. ASA Grade Assessment: I - A normal, healthy                            patient. After reviewing the risks and benefits,                            the patient was deemed in satisfactory condition to                            undergo the procedure.  After obtaining informed consent, the endoscope was                            passed under direct vision. Throughout the                            procedure, the patient's blood pressure, pulse, and                            oxygen saturations were monitored continuously. The                            Model GIF-HQ190 [id number]) scope was introduced                            through the mouth, and advanced to the  second part                            of duodenum. The upper GI endoscopy was                            accomplished without difficulty. The patient                            tolerated the procedure well. Scope In: Scope Out: Findings:                 The esophagus and gastroesophageal junction were                            examined with white light. There were esophageal                            mucosal changes consistent with long-segment                            Barrett's esophagus. These changes involved the                            mucosa extending to the Z-line. The maximum                            longitudinal extent of these esophageal mucosal                            changes was 5 cm in length. Prague classification                            C4, M. Mucosa was biopsied with a cold forceps for                            histology in 4 quadrants at intervals of 1.5 cm in  the lower third of the esophagus. A total of 2                            specimen bottles were sent to pathology.                           Multiple benign-appearing, intrinsic stenoses were                            found(ringed proximal esophagus above Barrett's).                            The scope was withdrawn, postprocedure. Dilation                            was performed with a Venia Minks dilator 16 [place].                            Mild resistance encountered. The dilation site was                            examined following endoscope reinsertion and showed                            moderate improvement in luminal narrowing. There                            was superficial linear tear along the area of the                            ringed esophagus.                           The entire examined stomach was normal.                           The cardia and gastric fundus were normal on                            retroflexion.                           The  examined duodenum was normal. Complications:            No immediate complications. Estimated Blood Loss:     Estimated blood loss was minimal. Impression:               - Esophageal mucosal changes consistent with                            long-segment Barrett's esophagus. Biopsied.                           - Benign-appearing esophageal stenoses. Dilated.                           -  Normal stomach.                           - Normal examined duodenum. Recommendation:           - Patient has a contact number available for                            emergencies. The signs and symptoms of potential                            delayed complications were discussed with the                            patient. Return to normal activities tomorrow.                            Written discharge instructions were provided to the                            patient.                           - Clear liquid diet for 4 hours, then soft food                            until a.m.Marland Kitchen                           - Continue present medications. Specifically,                            continue omeprazole 40 mg daily.                           - Await pathology results.                           - Repeat upper endoscopy for surveillance based on                            pathology results.                           - Return to GI clinic in 6 weeks, to see Dr. [name]. Docia Chuck. [name], MD 03/15/2016 10:23:51 AM This report has been signed electronically.

## 2016-03-15 NOTE — Progress Notes (Signed)
Called to room to assist during endoscopic procedure.  Patient ID and intended procedure confirmed with present staff. Received instructions for my participation in the procedure from the performing physician.  

## 2016-03-15 NOTE — Progress Notes (Signed)
A/ox3 pleased with MAC, report to Penny RN 

## 2016-03-18 ENCOUNTER — Telehealth: Payer: Self-pay | Admitting: *Deleted

## 2016-03-18 NOTE — Telephone Encounter (Signed)
Called patient with Dr. Blanch Media recommendations to see PCP if symptoms worsen. Patient stating he plans to stop by Urgent Care on the way home from work.

## 2016-03-18 NOTE — Telephone Encounter (Signed)
  Follow up Call-  Call back number 03/15/2016  Post procedure Call Back phone  # [phone]  Permission to leave phone message Yes     Patient questions:  Do you have a fever, pain , or abdominal swelling? No. Pain Score  0 *  Have you tolerated food without any problems? Yes.    Have you been able to return to your normal activities? Yes.    Do you have any questions about your discharge instructions: Diet   No. Medications  No. Follow up visit  No.  Do you have questions or concerns about your Care? No.  Actions: * If pain score is 4 or above: No action needed, pain <4.  Patient stating he had a sore throat on Saturday am. Patient developed a cough on Sunday. The cough today is more severe, bark like in sound. Cough is productive, brownish yellow in appearance. Slight fever this am. Advised to check with PCP. Note sent to Dr. [name] for suggestions, will call patient back later today.

## 2016-03-18 NOTE — Telephone Encounter (Signed)
Typically don't relate those complaints to his procedure. If problems persist, he should see his PCP as he may need antibiotics

## 2016-03-20 ENCOUNTER — Encounter: Payer: Self-pay | Admitting: Internal Medicine

## 2016-05-06 ENCOUNTER — Ambulatory Visit: Payer: BLUE CROSS/BLUE SHIELD | Admitting: Internal Medicine

## 2016-05-15 ENCOUNTER — Ambulatory Visit (INDEPENDENT_AMBULATORY_CARE_PROVIDER_SITE_OTHER): Payer: BLUE CROSS/BLUE SHIELD | Admitting: Internal Medicine

## 2016-05-15 ENCOUNTER — Encounter: Payer: Self-pay | Admitting: Internal Medicine

## 2016-05-15 VITALS — BP 110/70 | HR 92 | Ht 66.14 in | Wt 190.2 lb

## 2016-05-15 DIAGNOSIS — R1032 Left lower quadrant pain: Secondary | ICD-10-CM

## 2016-05-15 DIAGNOSIS — K219 Gastro-esophageal reflux disease without esophagitis: Secondary | ICD-10-CM | POA: Diagnosis not present

## 2016-05-15 DIAGNOSIS — K222 Esophageal obstruction: Secondary | ICD-10-CM | POA: Diagnosis not present

## 2016-05-15 DIAGNOSIS — K2270 Barrett's esophagus without dysplasia: Secondary | ICD-10-CM | POA: Diagnosis not present

## 2016-05-15 DIAGNOSIS — R1310 Dysphagia, unspecified: Secondary | ICD-10-CM

## 2016-05-15 MED ORDER — OMEPRAZOLE 40 MG PO CPDR: 40.0000 mg | DELAYED_RELEASE_CAPSULE | Freq: Every day | ORAL | Status: DC

## 2016-05-15 NOTE — Patient Instructions (Addendum)
We have sent your prescription to [facility].  You will be due for a recall colonoscopy in 2018. We will send you a reminder in the mail when it gets closer to that time.

## 2016-05-15 NOTE — Progress Notes (Signed)
HISTORY OF PRESENT ILLNESS:  Anthony Benson is a 40 y.o. male was initially evaluated 02/13/2016 for significant intermittent solid food dysphagia, and GERD. He was prescribed omeprazole 40 mg daily and underwent upper endoscopy 03/15/2016. He was found to have long segment Barrett's esophagus measuring 5 cm as well as ringed proximal esophagus (strictures) above Barrett's. He was dilated to a maximal diameter of 50 [place]. Multiple biopsies obtained confirming Barrett's esophagus without dysplasia. He presents today for follow-up. First, he is pleased report he has no reflux symptoms. Next, he is pleased report that he has had no episodes of dysphagia. Eating foods that were previously problematic without issues. His only new complaint is that of vague left mid abdomen/left side discomfort. This has been present for approximately 2 weeks. Noticed upon awakening in the morning and dissipates spontaneously with time (30 minutes). No urologic complaints or bowel difficulties. No relationship to movement, meals, defecation, urination. His weight has been stable. He does not describe this as severe  REVIEW OF SYSTEMS:  All non-GI ROS negative except for sinus and allergy trouble, cough, fatigue  Past Medical History  Diagnosis Date  . Labral tear of shoulder left shoulder  . GERD (gastroesophageal reflux disease)     OTC -prn  . PONV (postoperative nausea and vomiting)     mild    Past Surgical History  Procedure Laterality Date  . Adenoidectomy  1980  . Shoulder arthroscopy  03/09/2012    Procedure: ARTHROSCOPY SHOULDER;  Surgeon: Johnn Hai, MD;  Location: [facility];  Service: Orthopedics;  Laterality: Left;  left shoulder scope exam under anes subarcomial decompression and mini open rotator cuff repair   ower  . Knee arthroscopy Left     x 3    Social History Brekken Isidore  reports that he has never smoked. He has never used smokeless tobacco. He reports that he  drinks alcohol. He reports that he does not use illicit drugs.  family history includes Barrett's esophagus (age of onset: 94) in his father; Diabetes in his father. There is no history of Colon cancer.  Allergies  Allergen Reactions  . Other Anaphylaxis    [place] Nuts  . Penicillins Anaphylaxis    Has patient had a PCN reaction causing immediate rash, facial/tongue/throat swelling, SOB or lightheadedness with hypotension: Yes Has patient had a PCN reaction causing severe rash involving mucus membranes or skin necrosis: Yes Has patient had a PCN reaction that required hospitalization No Has patient had a PCN reaction occurring within the last 10 years: No If all of the above answers are "NO", then may proceed with Cephalosporin use.        PHYSICAL EXAMINATION: Vital signs: BP 110/70 mmHg  Pulse 92  Ht 5' 6.14" (1.68 m)  Wt 190 lb 4 oz (86.297 kg)  BMI 30.58 kg/m2  Constitutional: generally well-appearing, no acute distress Psychiatric: alert and oriented x3, cooperative Eyes: extraocular movements intact, anicteric, conjunctiva pink Mouth: oral pharynx moist, no lesions Neck: supple no lymphadenopathy Cardiovascular: heart regular rate and rhythm, no murmur Lungs: clear to auscultation bilaterally Abdomen: soft, obese, nontender, nondistended, no obvious ascites, no peritoneal signs, normal bowel sounds, no organomegaly Rectal: Omitted Extremities: no clubbing, cyanosis or lower extremity edema bilaterally Skin: no lesions on visible extremities Neuro: No focal deficits.   ASSESSMENT:  #1. GERD. Symptoms resolved on PPI #2. Barrett's esophagus without dysplasia. Long segment. #3. Multiple strictures or ringed esophagus above Barrett's status post dilation. Dysphagia improved #4. Vague left-sided abdominal  discomfort. No worrisome features  PLAN:  #1. Reflux precautions #2. Continue daily omeprazole 40 mg. Prescription refill requested. Prescribed for one year #3.  Repeat upper endoscopy with biopsies in 1 year #4. Office follow-up in the interim for any questions or problems #5. Observe vague left-sided abdominal discomfort. Nothing further needs to be done if this resolves. Contact this office if it persists or worsens. He understands  25 minutes was spent face-to-face with the patient. Greater than 50% a time use use for counseling regarding his GERD complicated by stricture and Barrett's. As well recommendations for his left-sided discomfort

## 2017-01-22 ENCOUNTER — Encounter: Payer: Self-pay | Admitting: Internal Medicine

## 2017-05-13 ENCOUNTER — Other Ambulatory Visit: Payer: Self-pay | Admitting: Internal Medicine

## 2017-06-03 ENCOUNTER — Encounter: Payer: Self-pay | Admitting: Internal Medicine

## 2017-07-18 ENCOUNTER — Ambulatory Visit (AMBULATORY_SURGERY_CENTER): Payer: Self-pay

## 2017-07-18 VITALS — Ht 67.0 in | Wt 188.0 lb

## 2017-07-18 DIAGNOSIS — K2270 Barrett's esophagus without dysplasia: Secondary | ICD-10-CM

## 2017-07-18 NOTE — Progress Notes (Signed)
No allergies to eggs or soy No diet meds No home oxygen No past problems with anesthesia except PONV  Declined emmi

## 2017-07-21 IMAGING — CR DG CHEST 2V
2 series · 2 of 2 positions shown · non-contrast
Comparison: 11/25/2004

CLINICAL DATA: Dysphagia for 2 days

EXAM:
CHEST  2 VIEW

[w chest pa]
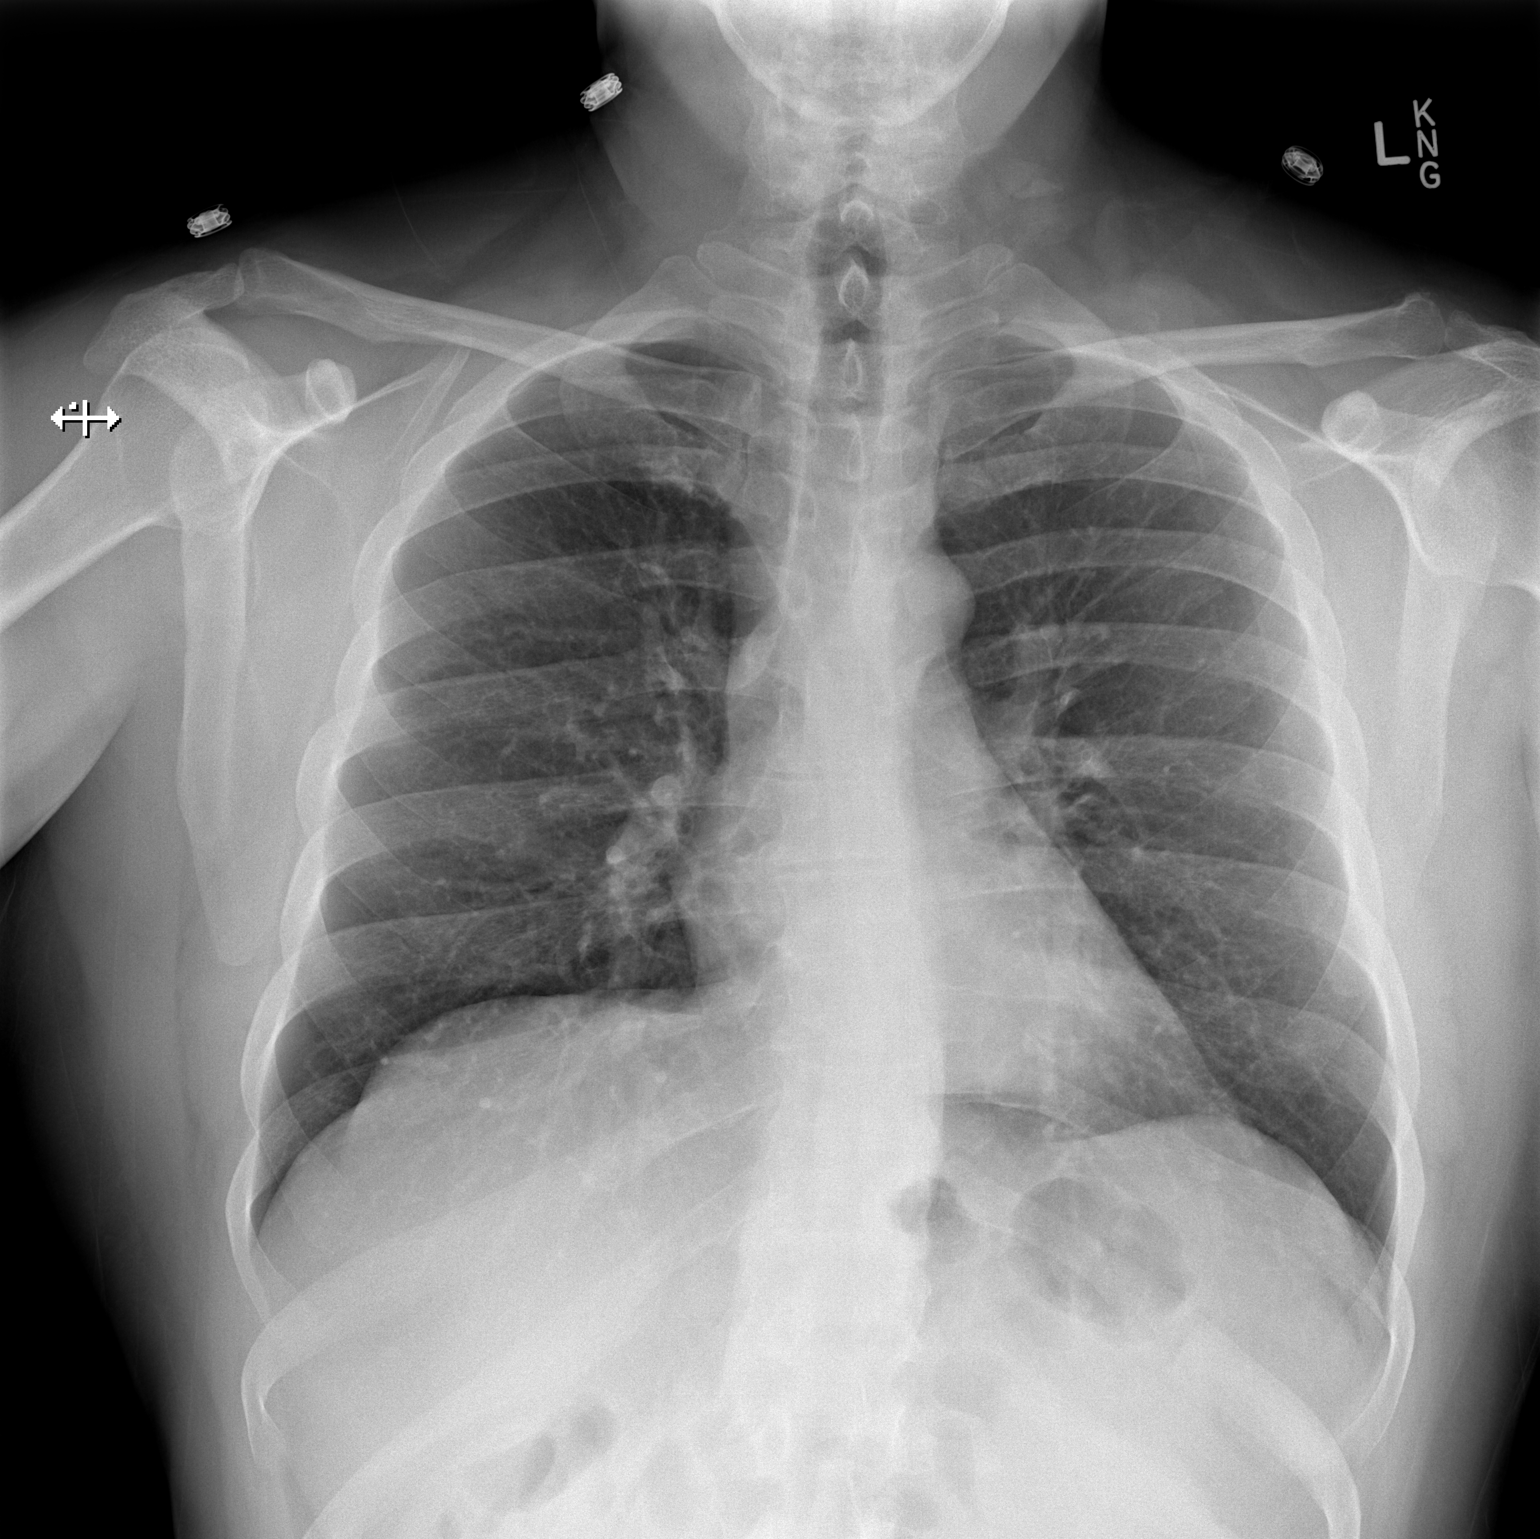

[w chest lat]
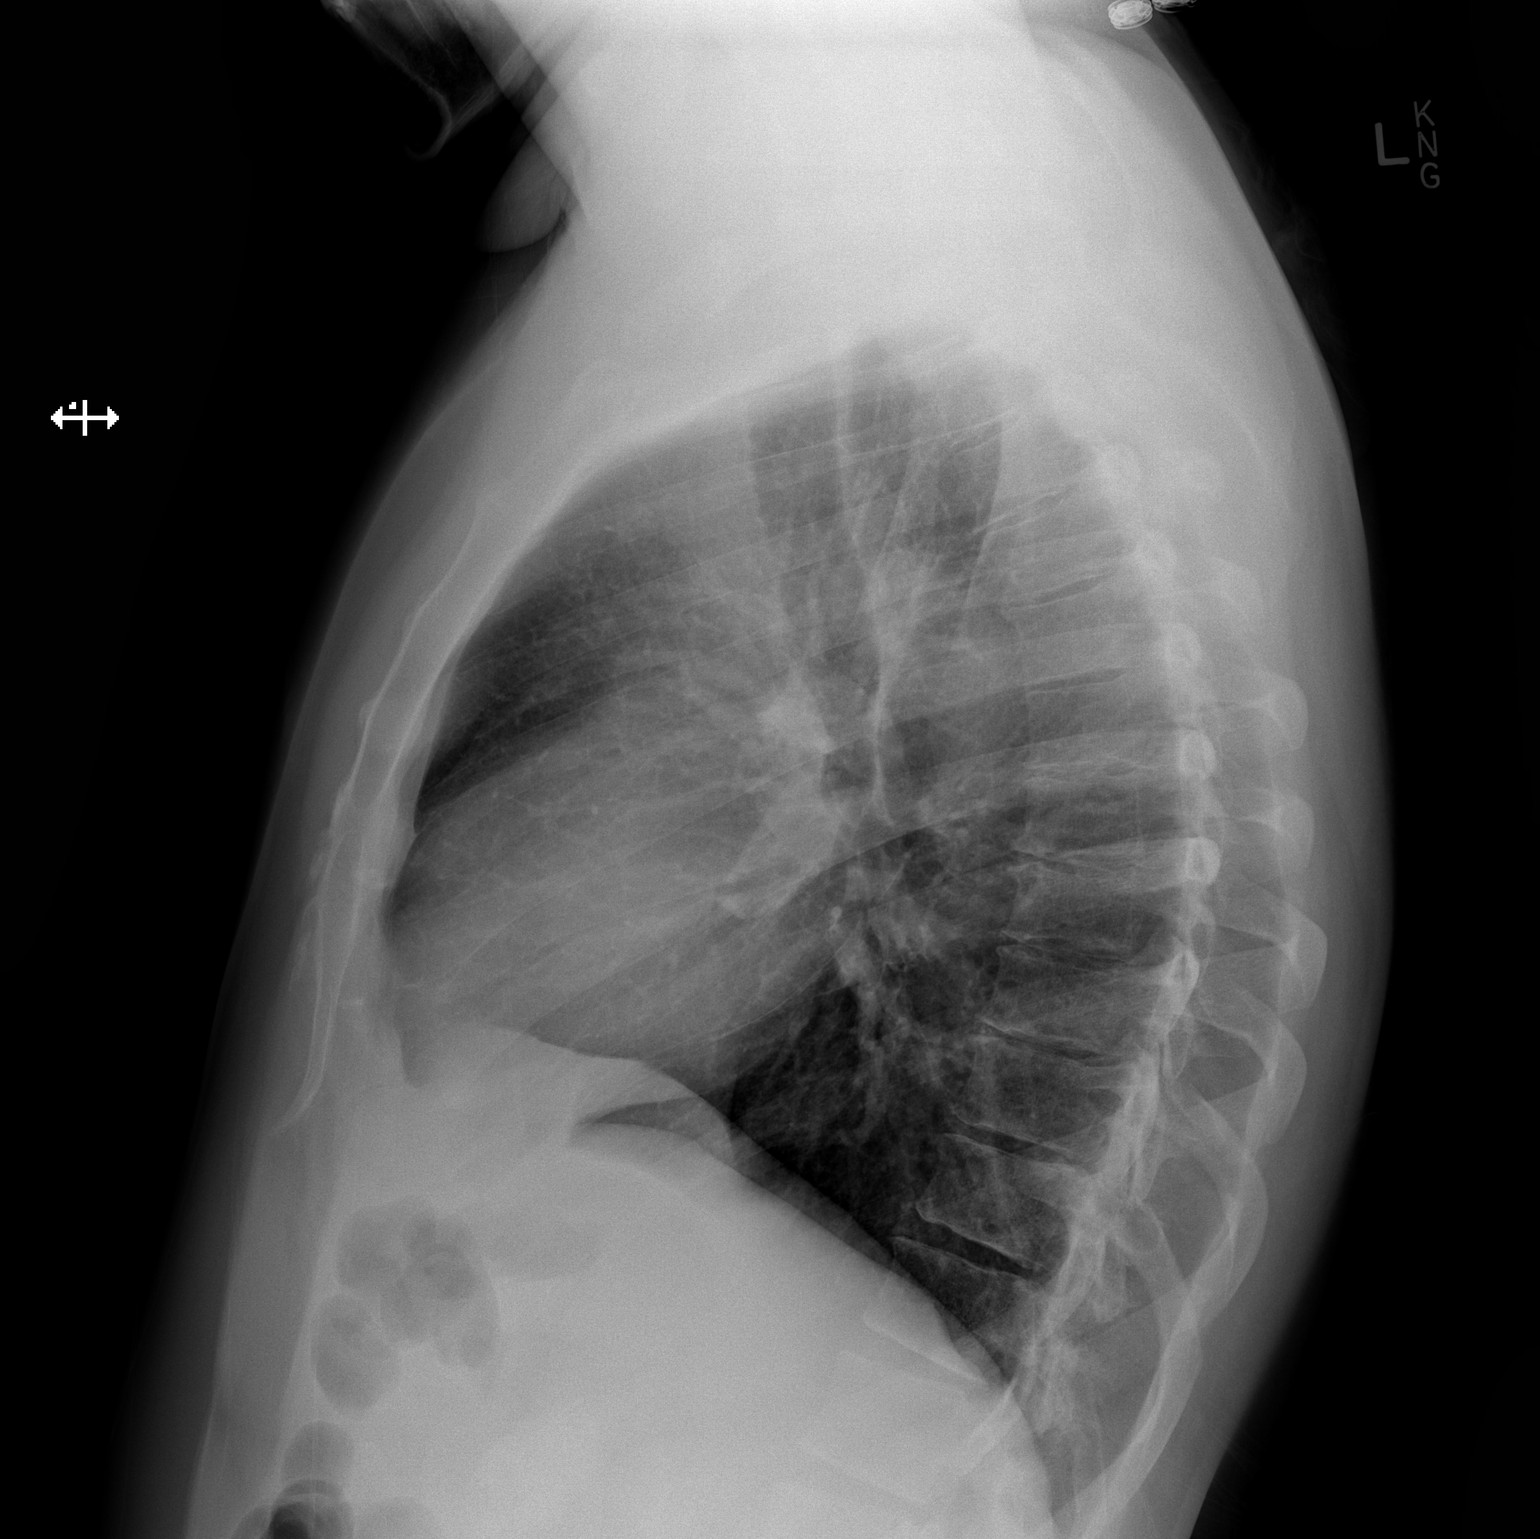

[2 of 2 positions shown; findings below may reference images not displayed]

FINDINGS: The heart size and mediastinal contours are within normal limits.
Both lungs are clear. The visualized skeletal structures are
unremarkable.
IMPRESSION: No active cardiopulmonary disease.

## 2017-07-21 IMAGING — CR DG NECK SOFT TISSUE
2 series · 2 of 2 positions shown · non-contrast
Comparison: None.

CLINICAL DATA: Dysphasia for 2 days.

EXAM:
NECK SOFT TISSUES - 1+ VIEW

[w soft tissue neck lat]
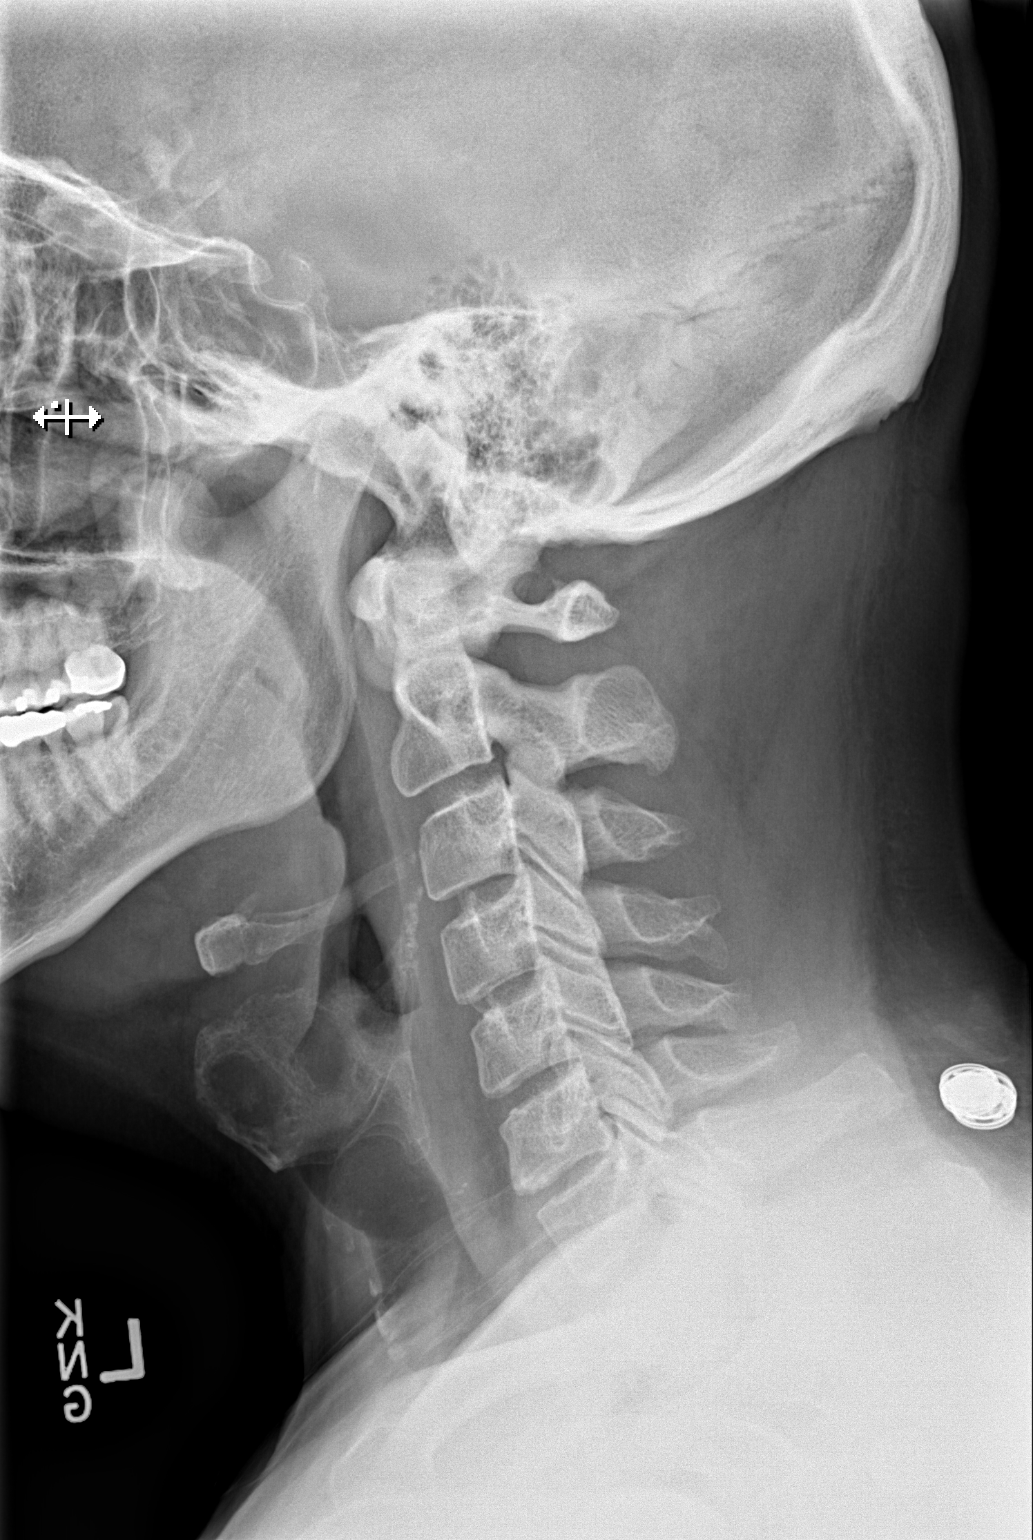

[w soft tissue neck ap]
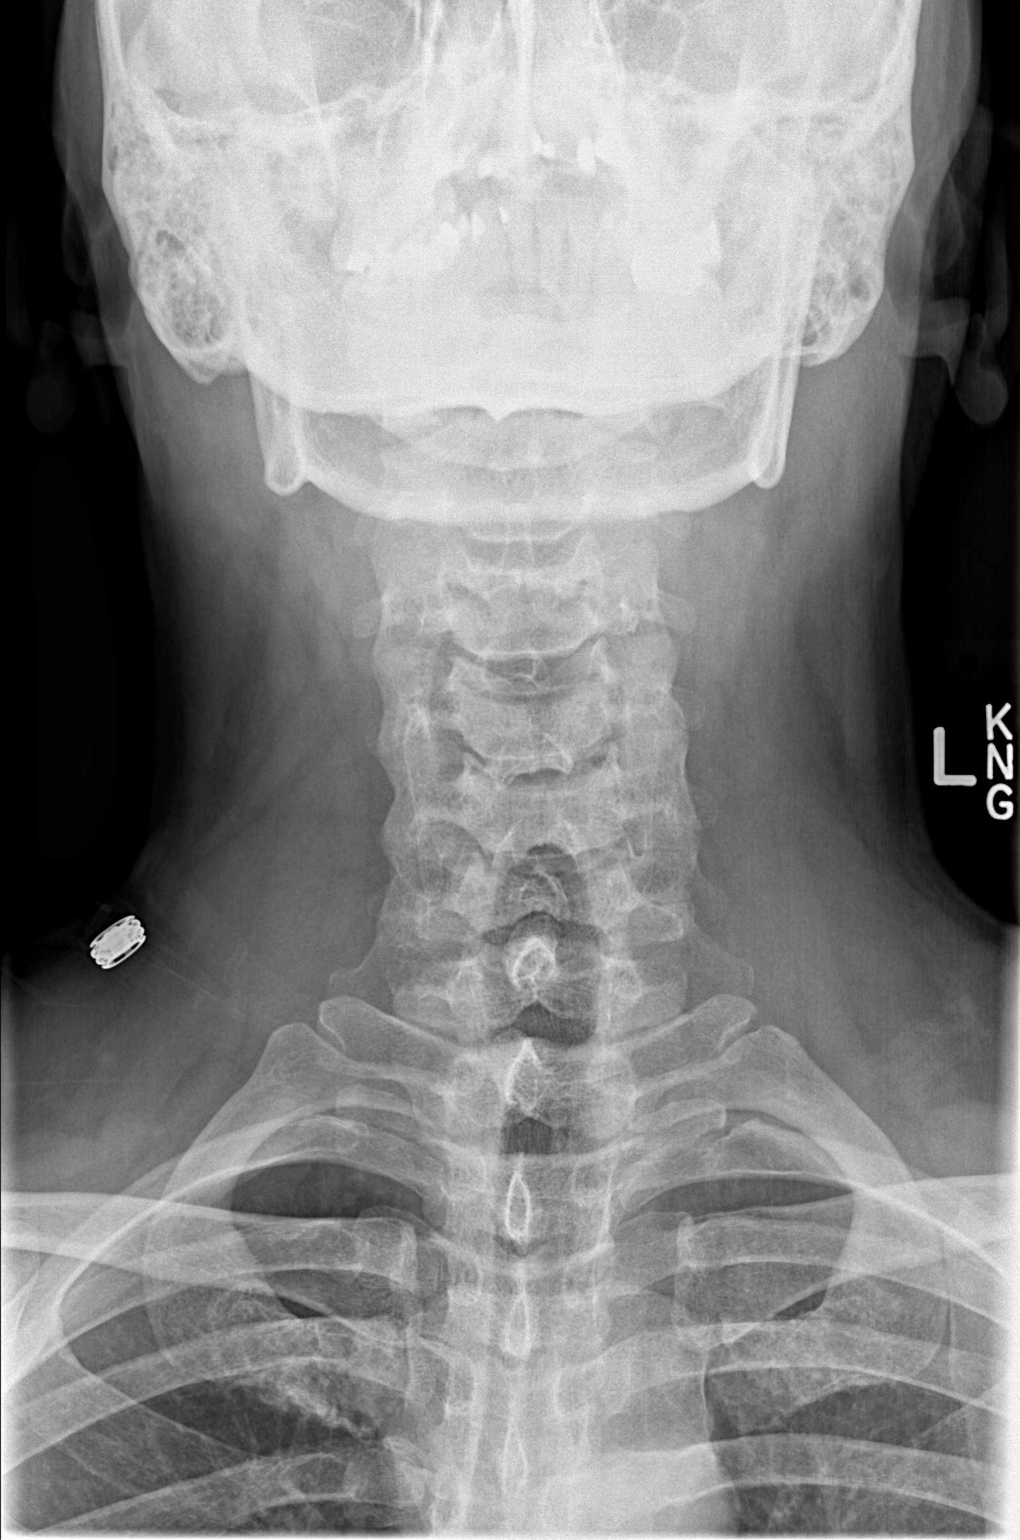

[2 of 2 positions shown; findings below may reference images not displayed]

FINDINGS: Two views study shows no abnormal prevertebral soft tissue swelling.
There is no evidence for gas in the prevertebral soft tissues.
Epiglottis and area epiglottic folds are normal. Visualized bony
anatomy is unremarkable.
IMPRESSION: Normal exam.

## 2017-07-28 ENCOUNTER — Encounter: Payer: Self-pay | Admitting: Internal Medicine

## 2017-08-01 ENCOUNTER — Ambulatory Visit (AMBULATORY_SURGERY_CENTER): Payer: BLUE CROSS/BLUE SHIELD | Admitting: Internal Medicine

## 2017-08-01 ENCOUNTER — Encounter: Payer: Self-pay | Admitting: Internal Medicine

## 2017-08-01 ENCOUNTER — Other Ambulatory Visit: Payer: Self-pay | Admitting: Internal Medicine

## 2017-08-01 VITALS — BP 100/60 | HR 94 | Temp 98.9°F | Resp 16 | Ht 66.0 in | Wt 188.0 lb

## 2017-08-01 DIAGNOSIS — K2270 Barrett's esophagus without dysplasia: Secondary | ICD-10-CM | POA: Diagnosis present

## 2017-08-01 MED ORDER — OMEPRAZOLE 40 MG PO CPDR: 40.0000 mg | DELAYED_RELEASE_CAPSULE | Freq: Every day | ORAL | 0 refills | Status: DC

## 2017-08-01 MED ORDER — SODIUM CHLORIDE 0.9 % IV SOLN: 500.0000 mL | INTRAVENOUS | Status: DC

## 2017-08-01 NOTE — Op Note (Signed)
[facility] Patient Name: Anthony Benson Procedure Date: 08/01/2017 9:49 AM MRN: [id number] Endoscopist: Docia Chuck. Anthony Benson , MD Age: 41 Referring MD:  Date of Birth: Mar 05, 1976 Gender: Male Account #: [id number] Procedure:                Upper GI endoscopy, with biopsies Indications:              Surveillance for malignancy due to personal history                            of Barrett's esophagus. Index examination May 2017                            with long segment nondysplastic Barrett's Medicines:                Monitored Anesthesia Care Procedure:                Pre-Anesthesia Assessment:                           - Prior to the procedure, a History and Physical                            was performed, and patient medications and                            allergies were reviewed. The patient's tolerance of                            previous anesthesia was also reviewed. The risks                            and benefits of the procedure and the sedation                            options and risks were discussed with the patient.                            All questions were answered, and informed consent                            was obtained. Prior Anticoagulants: The patient has                            taken no previous anticoagulant or antiplatelet                            agents. ASA Grade Assessment: I - A normal, healthy                            patient. After reviewing the risks and benefits,                            the patient was deemed in satisfactory condition to  undergo the procedure.                           After obtaining informed consent, the endoscope was                            passed under direct vision. Throughout the                            procedure, the patient's blood pressure, pulse, and                            oxygen saturations were monitored continuously. The   Endoscope was introduced through the mouth, and                            advanced to the second part of duodenum. The upper                            GI endoscopy was accomplished without difficulty.                            The patient tolerated the procedure well. Scope In: Scope Out: Findings:                 The esophagus and gastroesophageal junction were                            examined with white light and narrow band imaging                            (NBI) from a forward view and retroflexed position.                            There were esophageal mucosal changes secondary to                            established long-segment Barrett's disease. These                            changes involved the mucosa extending to the                            Z-line. The maximum longitudinal extent of these                            esophageal mucosal changes was 5 cm in length                            (C5,M5). There was no obvious inflammation,                            nodularity, or other suspicious mucosal change.  Mucosa was biopsied with a cold forceps for                            histology in 4 quadrants at intervals of 1.5 cm in                            the lower third of the esophagus. A total of 2                            specimen bottles were sent to pathology. Total of                            16 biopsies obtained.                           The stomach was normal, save a 3-4 cm hiatal hernia.                           The examined duodenum was normal.                           The cardia and gastric fundus were normal on                            retroflexion. Complications:            No immediate complications. Estimated Blood Loss:     Estimated blood loss: none. Impression:               - Esophageal mucosal changes secondary to                            established long-segment Barrett's disease.                             Biopsied.                           - Normal stomach. Hiatal hernia.                           - Normal examined duodenum. Recommendation:           - Patient has a contact number available for                            emergencies. The signs and symptoms of potential                            delayed complications were discussed with the                            patient. Return to normal activities tomorrow.                            Written discharge instructions were provided to the  patient.                           - Resume previous diet.                           - Continue present medications.                           - Await pathology results.                           - Repeat EGD with biopsies in 3 years if no                            dysplasia today. Docia Chuck. Anthony Pastor, MD 08/01/2017 10:20:50 AM This report has been signed electronically.

## 2017-08-01 NOTE — Patient Instructions (Signed)
Impression/Recommendations:  Hiatal hernia handout given to patient.  Resume previous diet.  Continue present medications.  Await pathology results.  Repeat EGD with biopsies in 3 years if no dysplasia today.  YOU HAD AN ENDOSCOPIC PROCEDURE TODAY AT [facility]:   Refer to the procedure report that was given to you for any specific questions about what was found during the examination.  If the procedure report does not answer your questions, please call your gastroenterologist to clarify.  If you requested that your care partner not be given the details of your procedure findings, then the procedure report has been included in a sealed envelope for you to review at your convenience later.  YOU SHOULD EXPECT: Some feelings of bloating in the abdomen. Passage of more gas than usual.  Walking can help get rid of the air that was put into your GI tract during the procedure and reduce the bloating. If you had a lower endoscopy (such as a colonoscopy or flexible sigmoidoscopy) you may notice spotting of blood in your stool or on the toilet paper. If you underwent a bowel prep for your procedure, you may not have a normal bowel movement for a few days.  Please Note:  You might notice some irritation and congestion in your nose or some drainage.  This is from the oxygen used during your procedure.  There is no need for concern and it should clear up in a day or so.  SYMPTOMS TO REPORT IMMEDIATELY:  Following upper endoscopy (EGD)  Vomiting of blood or coffee ground material  New chest pain or pain under the shoulder blades  Painful or persistently difficult swallowing  New shortness of breath  Fever of 100F or higher  Black, tarry-looking stools  For urgent or emergent issues, a gastroenterologist can be reached at any hour by calling [phone].   DIET:  We do recommend a small meal at first, but then you may proceed to your regular diet.  Drink plenty of fluids but you  should avoid alcoholic beverages for 24 hours.  ACTIVITY:  You should plan to take it easy for the rest of today and you should NOT DRIVE or use heavy machinery until tomorrow (because of the sedation medicines used during the test).    FOLLOW UP: Our staff will call the number listed on your records the next business day following your procedure to check on you and address any questions or concerns that you may have regarding the information given to you following your procedure. If we do not reach you, we will leave a message.  However, if you are feeling well and you are not experiencing any problems, there is no need to return our call.  We will assume that you have returned to your regular daily activities without incident.  If any biopsies were taken you will be contacted by phone or by letter within the next 1-3 weeks.  Please call us at [phone] if you have not heard about the biopsies in 3 weeks.    SIGNATURES/CONFIDENTIALITY: You and/or your care partner have signed paperwork which will be entered into your electronic medical record.  These signatures attest to the fact that that the information above on your After Visit Summary has been reviewed and is understood.  Full responsibility of the confidentiality of this discharge information lies with you and/or your care-partner.

## 2017-08-01 NOTE — Progress Notes (Signed)
To recovery, report to RN, VSS. 

## 2017-08-01 NOTE — Progress Notes (Signed)
Called to room to assist during endoscopic procedure.  Patient ID and intended procedure confirmed with present staff. Received instructions for my participation in the procedure from the performing physician.Pt's states no medical or surgical changes since previsit or office visit. 

## 2017-08-04 ENCOUNTER — Telehealth: Payer: Self-pay

## 2017-08-04 ENCOUNTER — Telehealth: Payer: Self-pay | Admitting: *Deleted

## 2017-08-04 NOTE — Telephone Encounter (Signed)
Left message on f/u call 

## 2017-08-04 NOTE — Telephone Encounter (Signed)
  Follow up Call-  Call back number 08/01/2017 03/15/2016  Post procedure Call Back phone  # [phone]  Permission to leave phone message Yes Yes  Some recent data might be hidden     Left message

## 2017-08-05 ENCOUNTER — Encounter: Payer: Self-pay | Admitting: Internal Medicine

## 2017-10-19 ENCOUNTER — Other Ambulatory Visit: Payer: Self-pay | Admitting: Internal Medicine

## 2018-01-07 ENCOUNTER — Other Ambulatory Visit: Payer: Self-pay | Admitting: Internal Medicine

## 2018-07-27 ENCOUNTER — Other Ambulatory Visit: Payer: Self-pay | Admitting: Internal Medicine

## 2018-08-31 ENCOUNTER — Encounter: Payer: Self-pay | Admitting: Internal Medicine

## 2019-01-30 ENCOUNTER — Other Ambulatory Visit: Payer: Self-pay | Admitting: Internal Medicine

## 2019-07-15 ENCOUNTER — Other Ambulatory Visit: Payer: Self-pay | Admitting: Internal Medicine

## 2019-08-17 ENCOUNTER — Telehealth: Payer: Self-pay | Admitting: Internal Medicine

## 2019-08-17 MED ORDER — OMEPRAZOLE 40 MG PO CPDR: DELAYED_RELEASE_CAPSULE | ORAL | 0 refills | Status: DC

## 2019-08-17 NOTE — Telephone Encounter (Signed)
Refilled Prilosec

## 2019-08-31 ENCOUNTER — Encounter: Payer: Self-pay | Admitting: Internal Medicine

## 2019-08-31 ENCOUNTER — Ambulatory Visit (INDEPENDENT_AMBULATORY_CARE_PROVIDER_SITE_OTHER): Payer: 59 | Admitting: Internal Medicine

## 2019-08-31 VITALS — BP 110/92 | HR 72 | Temp 98.3°F | Ht 66.14 in | Wt 189.0 lb

## 2019-08-31 DIAGNOSIS — K222 Esophageal obstruction: Secondary | ICD-10-CM | POA: Diagnosis not present

## 2019-08-31 DIAGNOSIS — K219 Gastro-esophageal reflux disease without esophagitis: Secondary | ICD-10-CM | POA: Diagnosis not present

## 2019-08-31 DIAGNOSIS — K2270 Barrett's esophagus without dysplasia: Secondary | ICD-10-CM

## 2019-08-31 MED ORDER — OMEPRAZOLE 40 MG PO CPDR: DELAYED_RELEASE_CAPSULE | ORAL | 3 refills | Status: DC

## 2019-08-31 NOTE — Progress Notes (Signed)
HISTORY OF PRESENT ILLNESS:  Anthony Benson is a 43 y.o. male, Hotel manager at [employer], with a history of GERD complicated by Barrett's esophagus and peptic stricture who presents today for follow-up regarding management of his complicated GERD and medication refill.  Patient was initially evaluated April 2017.  Subsequent upper endoscopy May 2017 revealed long segment Barrett's esophagus approximately 5 cm and ringed proximal esophagus above the Barrett's which was dilated with 50 [place] Maloney dilator.  He was last seen in this office July 2017 for follow-up.  He has continued on omeprazole 40 mg daily with excellent control symptoms.  No recurrent dysphagia.  His last surveillance upper endoscopy was October 2018.  At that time he was found to have a 5 cm segment of Barrett's esophagus and a 3 to 4 cm hiatal hernia.  Biopsies revealed nondysplastic Barrett's esophagus for which follow-up in 3 years were recommended.  Patient does request medication refill.  His interval medical history has been stable.  He is on no other medications besides a multivitamin.  Review of interval laboratories and x-rays finds no relevant interval data  REVIEW OF SYSTEMS:  All non-GI ROS negative unless otherwise stated in the HPI except for allergies  Past Medical History:  Diagnosis Date  . Allergy   . GERD (gastroesophageal reflux disease)    OTC -prn  . Labral tear of shoulder left shoulder  . PONV (postoperative nausea and vomiting)    mild    Past Surgical History:  Procedure Laterality Date  . ADENOIDECTOMY  1980  . KNEE ARTHROSCOPY Left    x 3  . SHOULDER ARTHROSCOPY  03/09/2012   Procedure: ARTHROSCOPY SHOULDER;  Surgeon: Johnn Hai, MD;  Location: [facility];  Service: Orthopedics;  Laterality: Left;  left shoulder scope exam under anes subarcomial decompression and mini open rotator cuff repair   ower    Social History Anthony Benson  reports that he has never smoked. He  has never used smokeless tobacco. He reports current alcohol use. He reports that he does not use drugs.  family history includes Barrett's esophagus (age of onset: 68) in his father; Diabetes in his father.  Allergies  Allergen Reactions  . Other Anaphylaxis    [place] Nuts  . Penicillins Anaphylaxis    Has patient had a PCN reaction causing immediate rash, facial/tongue/throat swelling, SOB or lightheadedness with hypotension: Yes Has patient had a PCN reaction causing severe rash involving mucus membranes or skin necrosis: Yes Has patient had a PCN reaction that required hospitalization No Has patient had a PCN reaction occurring within the last 10 years: No If all of the above answers are "NO", then may proceed with Cephalosporin use.        PHYSICAL EXAMINATION: Vital signs: BP (!) 110/92 (BP Location: Left Arm, Patient Position: Sitting, Cuff Size: Normal)   Pulse 72   Temp 98.3 F (36.8 C)   Ht 5' 6.14" (1.68 m)   Wt 189 lb (85.7 kg)   BMI 30.38 kg/m   Constitutional: generally well-appearing, no acute distress Psychiatric: alert and oriented x3, cooperative Eyes: extraocular movements intact, anicteric, conjunctiva pink Mouth: oral pharynx moist, no lesions Neck: supple no lymphadenopathy Cardiovascular: heart regular rate and rhythm, no murmur Lungs: clear to auscultation bilaterally Abdomen: soft, nontender, nondistended, no obvious ascites, no peritoneal signs, normal bowel sounds, no organomegaly Rectal: Omitted Extremities: no clubbing, cyanosis, or lower extremity edema bilaterally Skin: no lesions on visible extremities Neuro: No focal deficits. No asterixis.  ASSESSMENT:  1.  Chronic GERD complicated by Barrett's esophagus and peptic stricture.  Patient remains asymptomatic post dilation.  Last EGD October 2018 with nondysplastic Barrett's (long segment)   PLAN:  1.  Reflux precautions 2.  Refill omeprazole 40 mg daily.  Medication risks  reviewed 3.  Plan surveillance upper endoscopy in 1 year (3 years from his last exam) regarding his nondysplastic Barrett's. 4.  Interval follow-up as needed 5.  Screening colonoscopy between age 45 and 59

## 2019-08-31 NOTE — Patient Instructions (Signed)
We have sent the following medications to your pharmacy for you to pick up at your convenience:  Omeprazole  You will be due for an endoscopy in a year

## 2019-09-14 ENCOUNTER — Other Ambulatory Visit: Payer: Self-pay | Admitting: Internal Medicine

## 2019-09-27 ENCOUNTER — Other Ambulatory Visit: Payer: Self-pay

## 2019-09-27 ENCOUNTER — Telehealth: Payer: Self-pay | Admitting: Internal Medicine

## 2019-09-27 MED ORDER — OMEPRAZOLE 40 MG PO CPDR: DELAYED_RELEASE_CAPSULE | ORAL | 3 refills | Status: DC

## 2019-09-27 NOTE — Telephone Encounter (Signed)
Pt is requesting prescription for omeprazole sent to [facility] for 90day supply. He states that his insurance doers not cover med at [facility]. [facility] but they said that they have not received it and need a new one.

## 2019-09-27 NOTE — Progress Notes (Signed)
Script resent to [facility]

## 2019-09-27 NOTE — Telephone Encounter (Signed)
Script sent to [facility] per pt request.

## 2020-09-09 ENCOUNTER — Other Ambulatory Visit: Payer: Self-pay | Admitting: Internal Medicine

## 2020-12-05 ENCOUNTER — Encounter: Payer: Self-pay | Admitting: Internal Medicine

## 2021-01-25 ENCOUNTER — Other Ambulatory Visit: Payer: Self-pay

## 2021-01-25 ENCOUNTER — Ambulatory Visit
Admission: RE | Admit: 2021-01-25 | Discharge: 2021-01-25 | Disposition: A | Discharge status: Home / Self Care | Payer: BC Managed Care – PPO | Source: Ambulatory Visit | Attending: Family Medicine | Admitting: Family Medicine

## 2021-01-25 VITALS — BP 118/83 | HR 96 | Temp 98.9°F | Resp 17

## 2021-01-25 DIAGNOSIS — J0100 Acute maxillary sinusitis, unspecified: Secondary | ICD-10-CM | POA: Diagnosis not present

## 2021-01-25 MED ORDER — AZITHROMYCIN 250 MG PO TABS: 250.0000 mg | ORAL_TABLET | Freq: Every day | ORAL | 0 refills | Status: DC

## 2021-01-25 NOTE — ED Triage Notes (Signed)
Pt presents with sinus pressure, productive cough, and congestion xs 2-3 days. States has been using Mucinex DM and Sudafed with some relief.

## 2021-01-25 NOTE — ED Provider Notes (Signed)
[facility]   [id number] 01/25/21 Arrival Time: 8242  ASSESSMENT & PLAN:  1. Acute non-recurrent maxillary sinusitis    Begin: Meds ordered this encounter  Medications  . azithromycin (ZITHROMAX) 250 MG tablet    Sig: Take 1 tablet (250 mg total) by mouth daily. Take first 2 tablets together, then 1 every day until finished.    Dispense:  6 tablet    Refill:  0   Desires COVID testing. OTC allergy med recommend.   Discharge Instructions     You have been tested for COVID-19 today. If your test returns positive, you will receive a phone call from [facility] regarding your results. Negative test results are not called. Both positive and negative results area always visible on MyChart. If you do not have a MyChart account, sign up instructions are provided in your discharge papers. Please do not hesitate to contact us should you have questions or concerns.      OTC symptom care as needed. Ensure adequate fluid intake and rest.   Follow-up Information    [facility] .   Specialty: Urgent Care Why: As needed. Contact information: [address] [place] [id number] [place] [id number] [phone]              Reviewed expectations re: course of current medical issues. Questions answered. Outlined signs and symptoms indicating need for more acute intervention. Patient verbalized understanding. After Visit Summary given.   SUBJECTIVE: History from: patient.  Anthony Benson is a 45 y.o. male who presents with complaint of nasal congestion, post-nasal drainage, and maxillary sinus pain. Onset gradual, over past sev days; allergies with mild cong 1-2 weeks. Respiratory symptoms: none. Fever: absent. Overall normal PO intake without n/v. OTC treatment: Mucinex without much relief. Seasonal allergies: yes. History of frequent sinus infections: no. No specific aggravating or alleviating factors  reported. Social History   Tobacco Use  Smoking Status Never Smoker  Smokeless Tobacco Never Used    OBJECTIVE:  Vitals:   01/25/21 1053  BP: 118/83  Pulse: 96  Resp: 17  Temp: 98.9 F (37.2 C)  TempSrc: Oral  SpO2: 96%     General appearance: alert; no distress HEENT: nasal congestion; clear runny nose; throat irritation secondary to post-nasal drainage; bilateral maxillary tenderness to palpation; turbinates boggy Neck: supple without LAD; trachea midline Lungs: unlabored respirations, symmetrical air entry; cough: absent; no respiratory distress Skin: warm and dry Psychological: alert and cooperative; normal mood and affect  Allergies  Allergen Reactions  . Other Anaphylaxis    [place] Nuts  . Penicillins Anaphylaxis    Has patient had a PCN reaction causing immediate rash, facial/tongue/throat swelling, SOB or lightheadedness with hypotension: Yes Has patient had a PCN reaction causing severe rash involving mucus membranes or skin necrosis: Yes Has patient had a PCN reaction that required hospitalization No Has patient had a PCN reaction occurring within the last 10 years: No If all of the above answers are "NO", then may proceed with Cephalosporin use.     Past Medical History:  Diagnosis Date  . Allergy   . GERD (gastroesophageal reflux disease)    OTC -prn  . Labral tear of shoulder left shoulder  . PONV (postoperative nausea and vomiting)    mild   Family History  Problem Relation Age of Onset  . Diabetes Father   . Barrett's esophagus Father 5  . Colon cancer Neg Hx    Social History   Socioeconomic  History  . Marital status: Married    Spouse name: Not on file  . Number of children: 2  . Years of education: Not on file  . Highest education level: Not on file  Occupational History  . Occupation: Photographer  Tobacco Use  . Smoking status: Never Smoker  . Smokeless tobacco: Never Used  Substance and Sexual Activity  . Alcohol use:  Yes    Alcohol/week: 0.0 standard drinks    Comment: 2 monthly  . Drug use: No  . Sexual activity: Not on file  Other Topics Concern  . Not on file  Social History Narrative  . Not on file   Social Determinants of Health   Financial Resource Strain: Not on file  Food Insecurity: Not on file  Transportation Needs: Not on file  Physical Activity: Not on file  Stress: Not on file  Social Connections: Not on file  Intimate Partner Violence: Not on file            Vanessa Kick, MD 01/25/21 1104

## 2021-01-25 NOTE — Discharge Instructions (Signed)
You have been tested for COVID-19 today. °If your test returns positive, you will receive a phone call from [facility] regarding your results. °Negative test results are not called. °Both positive and negative results area always visible on MyChart. °If you do not have a MyChart account, sign up instructions are provided in your discharge papers. °Please do not hesitate to contact us should you have questions or concerns. ° °

## 2021-01-26 LAB — NOVEL CORONAVIRUS, NAA: SARS-CoV-2, NAA: NOT DETECTED

## 2021-01-26 LAB — SARS-COV-2, NAA 2 DAY TAT

## 2021-02-27 ENCOUNTER — Encounter: Payer: Self-pay | Admitting: Internal Medicine

## 2021-03-06 ENCOUNTER — Other Ambulatory Visit: Payer: Self-pay | Admitting: Internal Medicine

## 2021-03-06 ENCOUNTER — Ambulatory Visit
Admission: EM | Admit: 2021-03-06 | Discharge: 2021-03-06 | Disposition: A | Discharge status: Home / Self Care | Payer: BC Managed Care – PPO | Attending: Emergency Medicine | Admitting: Emergency Medicine

## 2021-03-06 DIAGNOSIS — L089 Local infection of the skin and subcutaneous tissue, unspecified: Secondary | ICD-10-CM | POA: Diagnosis not present

## 2021-03-06 DIAGNOSIS — D234 Other benign neoplasm of skin of scalp and neck: Secondary | ICD-10-CM

## 2021-03-06 DIAGNOSIS — L720 Epidermal cyst: Secondary | ICD-10-CM

## 2021-03-06 MED ORDER — DOXYCYCLINE HYCLATE 100 MG PO CAPS: 100.0000 mg | ORAL_CAPSULE | Freq: Two times a day (BID) | ORAL | 0 refills | Status: AC

## 2021-03-06 NOTE — ED Triage Notes (Signed)
Pt states had a ruptured basil cyst to top of head x2 days with thick drainage. States has dermatology appt 5/23 and was told to come here to be checked.

## 2021-03-06 NOTE — ED Provider Notes (Signed)
[facility]    CSN: [id number] Arrival date & time: 03/06/21  1543      History   Chief Complaint Chief Complaint  Patient presents with  . ruptured cyst    HPI Anthony Benson is a 45 y.o. male presenting today for evaluation of scalp cyst.  Reports he has had a cyst to his scalp for a while, over the past 3 days developed some soreness associated with it and has been draining thick purulent drainage over the past couple of days.  Has plans to follow-up with dermatology on 5/23.  Reports remote history of needing cyst removed from scalp in the past.  Denies fevers  HPI  Past Medical History:  Diagnosis Date  . Allergy   . GERD (gastroesophageal reflux disease)    OTC -prn  . Labral tear of shoulder left shoulder  . PONV (postoperative nausea and vomiting)    mild    There are no problems to display for this patient.   Past Surgical History:  Procedure Laterality Date  . ADENOIDECTOMY  1980  . KNEE ARTHROSCOPY Left    x 3  . SHOULDER ARTHROSCOPY  03/09/2012   Procedure: ARTHROSCOPY SHOULDER;  Surgeon: Johnn Hai, MD;  Location: [facility];  Service: Orthopedics;  Laterality: Left;  left shoulder scope exam under anes subarcomial decompression and mini open rotator cuff repair   ower       Home Medications    Prior to Admission medications   Medication Sig Start Date End Date Taking? Authorizing Provider  doxycycline (VIBRAMYCIN) 100 MG capsule Take 1 capsule (100 mg total) by mouth 2 (two) times daily for 10 days. 03/06/21 03/16/21 Yes Alliene Klugh C, PA-C  Multiple Vitamin (MULTIVITAMIN) tablet Take 1 tablet by mouth daily.    [provider]  omeprazole (PRILOSEC) 40 MG capsule TAKE 1 CAPSULE DAILY.  Office visit for further refills 03/06/21   Irene Shipper, MD    Family History Family History  Problem Relation Age of Onset  . Diabetes Father   . Barrett's esophagus Father 13  . Colon cancer Neg Hx     Social  History Social History   Tobacco Use  . Smoking status: Never Smoker  . Smokeless tobacco: Never Used  Substance Use Topics  . Alcohol use: Yes    Alcohol/week: 0.0 standard drinks    Comment: 2 monthly  . Drug use: No     Allergies   Other and Penicillins   Review of Systems Review of Systems  Constitutional: Negative for fatigue and fever.  Eyes: Negative for redness, itching and visual disturbance.  Respiratory: Negative for shortness of breath.   Cardiovascular: Negative for chest pain and leg swelling.  Gastrointestinal: Negative for nausea and vomiting.  Musculoskeletal: Negative for arthralgias and myalgias.  Skin: Positive for color change and rash. Negative for wound.  Neurological: Negative for dizziness, syncope, weakness, light-headedness and headaches.     Physical Exam Triage Vital Signs ED Triage Vitals  Enc Vitals Group     BP 03/06/21 1646 137/84     Pulse Rate 03/06/21 1646 99     Resp 03/06/21 1646 18     Temp 03/06/21 1646 98 F (36.7 C)     Temp Source 03/06/21 1646 Oral     SpO2 03/06/21 1646 97 %     Weight --      Height --      Head Circumference --      Peak  Flow --      Pain Score 03/06/21 1647 0     Pain Loc --      Pain Edu? --      Excl. in [place]? --    No data found.  Updated Vital Signs BP 137/84 (BP Location: Left Arm)   Pulse 99   Temp 98 F (36.7 C) (Oral)   Resp 18   SpO2 97%   Visual Acuity Right Eye Distance:   Left Eye Distance:   Bilateral Distance:    Right Eye Near:   Left Eye Near:    Bilateral Near:     Physical Exam Vitals and nursing note reviewed.  Constitutional:      Appearance: He is well-developed.     Comments: No acute distress  HENT:     Head: Normocephalic and atraumatic.     Nose: Nose normal.  Eyes:     Conjunctiva/sclera: Conjunctivae normal.  Cardiovascular:     Rate and Rhythm: Normal rate.  Pulmonary:     Effort: Pulmonary effort is normal. No respiratory distress.  Abdominal:      General: There is no distension.  Musculoskeletal:        General: Normal range of motion.     Cervical back: Neck supple.  Skin:    General: Skin is warm and dry.     Comments: Scalp with area of erythema swelling and induration noted to left frontal area with crusting  Right parietal area with similar area of swelling without erythema, slightly fluctuant  Neurological:     Mental Status: He is alert and oriented to person, place, and time.      UC Treatments / Results  Labs (all labs ordered are listed, but only abnormal results are displayed) Labs Reviewed - No data to display  EKG   Radiology No results found.  Procedures Procedures (including critical care time)  Medications Ordered in UC Medications - No data to display  Initial Impression / Assessment and Plan / UC Course  I have reviewed the triage vital signs and the nursing notes.  Pertinent labs & imaging results that were available during my care of the patient were reviewed by me and considered in my medical decision making (see chart for details).     Appears to have epidermal cysts, area on left scalp more superiorly appears infected, placing on doxycycline, recommending warm compresses and anti-inflammatories, continue to follow-up with dermatology as planned for further removal of these.  Discussed strict return precautions. Patient verbalized understanding and is agreeable with plan.  Final Clinical Impressions(s) / UC Diagnoses   Final diagnoses:  Dermoid cyst of scalp  Infected epidermoid cyst     Discharge Instructions     Doxycycline twice daily for 10 days Warm compresses Tylenol and ibuprofen as needed Follow-up with dermatology as planned    ED Prescriptions    Medication Sig Dispense Auth. Provider   doxycycline (VIBRAMYCIN) 100 MG capsule Take 1 capsule (100 mg total) by mouth 2 (two) times daily for 10 days. 20 capsule Easton Fetty, Valier C, PA-C     PDMP not reviewed this  encounter.   Janith Lima, [place] 03/06/21 1713

## 2021-03-06 NOTE — Discharge Instructions (Signed)
Doxycycline twice daily for 10 days Warm compresses Tylenol and ibuprofen as needed Follow-up with dermatology as planned

## 2021-03-19 DIAGNOSIS — L7211 Pilar cyst: Secondary | ICD-10-CM | POA: Diagnosis not present

## 2021-03-19 DIAGNOSIS — D224 Melanocytic nevi of scalp and neck: Secondary | ICD-10-CM | POA: Diagnosis not present

## 2021-03-19 DIAGNOSIS — D485 Neoplasm of uncertain behavior of skin: Secondary | ICD-10-CM | POA: Diagnosis not present

## 2021-05-11 ENCOUNTER — Other Ambulatory Visit: Payer: Self-pay

## 2021-05-11 ENCOUNTER — Ambulatory Visit (AMBULATORY_SURGERY_CENTER): Payer: BC Managed Care – PPO | Admitting: *Deleted

## 2021-05-11 VITALS — Ht 67.25 in | Wt 183.0 lb

## 2021-05-11 DIAGNOSIS — K2270 Barrett's esophagus without dysplasia: Secondary | ICD-10-CM

## 2021-05-11 NOTE — Progress Notes (Signed)
No egg or soy allergy known to patient  No issues with past sedation with any surgeries or procedures Patient denies ever being told they had issues or difficulty with intubation  No FH of Malignant Hyperthermia No diet pills per patient No home 02 use per patient  No blood thinners per patient  Pt denies issues with constipation  No A fib or A flutter  EMMI video to pt or via [place] 19 guidelines implemented in [place] today with Pt and RN  Pt is fully vaccinated  for Covid   Due to the COVID-19 pandemic we are asking patients to follow certain guidelines.  Pt aware of COVID protocols and [facility] guidelines   7-20 pt will have a cyst removed from scalp- will have stitches on scalp

## 2021-05-16 DIAGNOSIS — L989 Disorder of the skin and subcutaneous tissue, unspecified: Secondary | ICD-10-CM | POA: Diagnosis not present

## 2021-05-16 DIAGNOSIS — L7211 Pilar cyst: Secondary | ICD-10-CM | POA: Diagnosis not present

## 2021-05-25 ENCOUNTER — Ambulatory Visit (AMBULATORY_SURGERY_CENTER): Payer: BC Managed Care – PPO | Admitting: Internal Medicine

## 2021-05-25 ENCOUNTER — Other Ambulatory Visit: Payer: Self-pay

## 2021-05-25 ENCOUNTER — Encounter: Payer: Self-pay | Admitting: Internal Medicine

## 2021-05-25 VITALS — BP 109/76 | HR 87 | Temp 97.8°F | Resp 12 | Ht 66.0 in | Wt 183.0 lb

## 2021-05-25 DIAGNOSIS — K2270 Barrett's esophagus without dysplasia: Secondary | ICD-10-CM | POA: Diagnosis not present

## 2021-05-25 DIAGNOSIS — K219 Gastro-esophageal reflux disease without esophagitis: Secondary | ICD-10-CM

## 2021-05-25 DIAGNOSIS — K222 Esophageal obstruction: Secondary | ICD-10-CM

## 2021-05-25 MED ORDER — SODIUM CHLORIDE 0.9 % IV SOLN: 500.0000 mL | Freq: Once | INTRAVENOUS | Status: DC

## 2021-05-25 NOTE — Progress Notes (Signed)
Called to room to assist during endoscopic procedure.  Patient ID and intended procedure confirmed with present staff. Received instructions for my participation in the procedure from the performing physician.  

## 2021-05-25 NOTE — Patient Instructions (Signed)
Read all of the handouts given to you  by your recovery room nurse. You will need another endoscopy in 3 years.  YOU HAD AN ENDOSCOPIC PROCEDURE TODAY AT [facility]:   Refer to the procedure report that was given to you for any specific questions about what was found during the examination.  If the procedure report does not answer your questions, please call your gastroenterologist to clarify.  If you requested that your care partner not be given the details of your procedure findings, then the procedure report has been included in a sealed envelope for you to review at your convenience later.  YOU SHOULD EXPECT: Some feelings of bloating in the abdomen. Passage of more gas than usual.  Walking can help get rid of the air that was put into your GI tract during the procedure and reduce the bloating.   Please Note:  You might notice some irritation and congestion in your nose or some drainage.  This is from the oxygen used during your procedure.  There is no need for concern and it should clear up in a day or so.  SYMPTOMS TO REPORT IMMEDIATELY:   Following upper endoscopy (EGD)  Vomiting of blood or coffee ground material  New chest pain or pain under the shoulder blades  Painful or persistently difficult swallowing  New shortness of breath  Fever of 100F or higher  Black, tarry-looking stools  For urgent or emergent issues, a gastroenterologist can be reached at any hour by calling [phone]. Do not use MyChart messaging for urgent concerns.    DIET:  We do recommend a small meal at first, but then you may proceed to your regular diet.  Drink plenty of fluids but you should avoid alcoholic beverages for 24 hours.  ACTIVITY:  You should plan to take it easy for the rest of today and you should NOT DRIVE or use heavy machinery until tomorrow (because of the sedation medicines used during the test).    FOLLOW UP: Our staff will call the number listed on your records  48-72 hours following your procedure to check on you and address any questions or concerns that you may have regarding the information given to you following your procedure. If we do not reach you, we will leave a message.  We will attempt to reach you two times.  During this call, we will ask if you have developed any symptoms of COVID 19. If you develop any symptoms (ie: fever, flu-like symptoms, shortness of breath, cough etc.) before then, please call [phone].  If you test positive for Covid 19 in the 2 weeks post procedure, please call and report this information to [place].    If any biopsies were taken you will be contacted by phone or by letter within the next 1-3 weeks.  Please call us at [phone] if you have not heard about the biopsies in 3 weeks.    SIGNATURES/CONFIDENTIALITY: You and/or your care partner have signed paperwork which will be entered into your electronic medical record.  These signatures attest to the fact that that the information above on your After Visit Summary has been reviewed and is understood.  Full responsibility of the confidentiality of this discharge information lies with you and/or your care-partner.

## 2021-05-25 NOTE — Progress Notes (Signed)
Report to PACU, RN, vss, BBS= Clear.  

## 2021-05-25 NOTE — Progress Notes (Signed)
Pt's states no medical or surgical changes since previsit or office visit. 

## 2021-05-25 NOTE — Op Note (Signed)
[facility] Patient Name: Anthony Benson Procedure Date: 05/25/2021 10:11 AM MRN: [id number] Endoscopist: Docia Chuck. [name] , MD Age: 45 Referring MD:  Date of Birth: Oct 28, 1976 Gender: Male Account #: [id number] Procedure:                Upper GI endoscopy with biopsies Indications:              Follow-up of Barrett's esophagus. History of                            nondysplastic Barrett's. Index exam 2017. Follow-up                            exam 2018. Has had a peptic stricture dilated                            previously. He continues on PPI without symptoms.                            No dysphagia. Now for surveillance Medicines:                Monitored Anesthesia Care Procedure:                Pre-Anesthesia Assessment:                           - Prior to the procedure, a History and Physical                            was performed, and patient medications and                            allergies were reviewed. The patient's tolerance of                            previous anesthesia was also reviewed. The risks                            and benefits of the procedure and the sedation                            options and risks were discussed with the patient.                            All questions were answered, and informed consent                            was obtained. Prior Anticoagulants: The patient has                            taken no previous anticoagulant or antiplatelet                            agents. ASA Grade Assessment: II - A patient with  mild systemic disease. After reviewing the risks                            and benefits, the patient was deemed in                            satisfactory condition to undergo the procedure.                           After obtaining informed consent, the endoscope was                            passed under direct vision. Throughout the                            procedure, the  patient's blood pressure, pulse, and                            oxygen saturations were monitored continuously. The                            Endoscope was introduced through the mouth, and                            advanced to the second part of duodenum. The upper                            GI endoscopy was accomplished without difficulty.                            The patient tolerated the procedure well. Scope In: Scope Out: Findings:                 Established Barrett's esophagus was present in the                            lower third of the esophagus. C5M5. This was                            examined under white light, NBI, and magnification.                            No particular irregularities noted. No active                            inflammation. Mucosa was biopsied with a cold                            forceps for histology in 4 quadrants at intervals                            of 1.5 cm. A total of 12-14 biopsies were taken.  The stomach was normal save moderate sized hiatal                            hernia.                           The examined duodenum was normal.                           The cardia and gastric fundus were normal on                            retroflexion. Complications:            No immediate complications. Estimated Blood Loss:     Estimated blood loss: none. Impression:               - Barrett's esophagus. Biopsied.                           - Normal stomach. Hiatal hernia.                           - Normal examined duodenum. Recommendation:           - Patient has a contact number available for                            emergencies. The signs and symptoms of potential                            delayed complications were discussed with the                            patient. Return to normal activities tomorrow.                            Written discharge instructions were provided to the                             patient.                           - Resume previous diet.                           - Continue present medications.                           - Await pathology results.                           - Repeat upper endoscopy in 3 years for                            surveillance if no dysplasia on biopsies. Docia Chuck. [name], MD 05/25/2021 10:39:49 AM This report has been signed electronically.

## 2021-05-29 ENCOUNTER — Encounter: Payer: Self-pay | Admitting: Internal Medicine

## 2021-05-29 ENCOUNTER — Telehealth: Payer: Self-pay

## 2021-05-29 NOTE — Telephone Encounter (Signed)
  Follow up Call-  Call back number 05/25/2021  Post procedure Call Back phone  # [phone]  Permission to leave phone message Yes  Some recent data might be hidden     Patient questions:  Do you have a fever, pain , or abdominal swelling? No. Pain Score  0 *  Have you tolerated food without any problems? Yes.    Have you been able to return to your normal activities? Yes.    Do you have any questions about your discharge instructions: Diet   No. Medications  No. Follow up visit  No.  Do you have questions or concerns about your Care? No.  Actions: * If pain score is 4 or above: No action needed, pain <4.  Have you developed a fever since your procedure? no  2.   Have you had an respiratory symptoms (SOB or cough) since your procedure? no  3.   Have you tested positive for COVID 19 since your procedure no  4.   Have you had any family members/close contacts diagnosed with the COVID 19 since your procedure?  no   If yes to any of these questions please route to Joylene John, RN and Joella Prince, RN

## 2021-05-30 ENCOUNTER — Other Ambulatory Visit: Payer: Self-pay | Admitting: Internal Medicine

## 2022-05-08 ENCOUNTER — Ambulatory Visit
Admission: EM | Admit: 2022-05-08 | Discharge: 2022-05-08 | Disposition: A | Discharge status: Home / Self Care | Payer: BC Managed Care – PPO | Attending: Emergency Medicine | Admitting: Emergency Medicine

## 2022-05-08 DIAGNOSIS — Z23 Encounter for immunization: Secondary | ICD-10-CM | POA: Diagnosis not present

## 2022-05-08 DIAGNOSIS — S61211A Laceration without foreign body of left index finger without damage to nail, initial encounter: Secondary | ICD-10-CM | POA: Diagnosis not present

## 2022-05-08 MED ORDER — TETANUS-DIPHTH-ACELL PERTUSSIS 5-2.5-18.5 LF-MCG/0.5 IM SUSY
0.5000 mL | PREFILLED_SYRINGE | Freq: Once | INTRAMUSCULAR | Status: AC
  Administered 2022-05-08: 0.5 mL via INTRAMUSCULAR

## 2022-05-08 NOTE — ED Provider Notes (Signed)
[facility]   MRN: [id number] DOB: 12-14-1975  Subjective:   Chief Complaint;  Chief Complaint  Patient presents with   Finger Injury   Pt here for some blade/saw device cutting left index finger. States last tetanus was 2018. Currently not bleeding. Less than 1.2 inch in length but can observe fatty tissue in depth  Anthony Benson is a 46 y.o. male presenting for finger laceration left index   Current Facility-Administered Medications:    Tdap (BOOSTRIX) injection 0.5 mL, 0.5 mL, Intramuscular, Once, Hezzie Bump, NP  Current Outpatient Medications:    Ibuprofen 200 MG CAPS, Advil, Disp: , Rfl:    Multiple Vitamin (MULTIVITAMIN) tablet, Take 1 tablet by mouth daily., Disp: , Rfl:    omeprazole (PRILOSEC) 40 MG capsule, TAKE 1 CAPSULE DAILY, Disp: 90 capsule, Rfl: 3   Allergies  Allergen Reactions   Other Anaphylaxis    [place] Nuts   Penicillins Anaphylaxis    Has patient had a PCN reaction causing immediate rash, facial/tongue/throat swelling, SOB or lightheadedness with hypotension: Yes Has patient had a PCN reaction causing severe rash involving mucus membranes or skin necrosis: Yes Has patient had a PCN reaction that required hospitalization No Has patient had a PCN reaction occurring within the last 10 years: No If all of the above answers are "NO", then may proceed with Cephalosporin use.     Past Medical History:  Diagnosis Date   Allergy    Barrett's esophagus    GERD (gastroesophageal reflux disease)    OTC -prn   Labral tear of shoulder left shoulder   PONV (postoperative nausea and vomiting)    mild     Review of Systems  All other systems reviewed and are negative.    Objective:   Vitals: BP 130/90 (BP Location: Left Arm)   Pulse 100   Temp 99 F (37.2 C) (Oral)   Resp 18   SpO2 97%   Physical Exam Vitals and nursing note reviewed.  Constitutional:      General: He is not in acute distress.    Appearance: Normal  appearance. He is normal weight. He is not ill-appearing or toxic-appearing.  HENT:     Head: Normocephalic and atraumatic.     Right Ear: External ear normal.     Left Ear: External ear normal.  Eyes:     Conjunctiva/sclera: Conjunctivae normal.  Pulmonary:     Effort: Pulmonary effort is normal.  Skin:    Comments: 5 mm laceration at the lateral aspect of the left index finger full range of motion no NVD D no foreign body or tendon injury noted.  Bleeding controlled with pressure  Neurological:     Mental Status: He is alert.  Psychiatric:        Mood and Affect: Mood normal.        Behavior: Behavior normal.     No results found for this or any previous visit (from the past 24 hour(s)).  No results found.     Assessment and Plan :   1. Laceration of left index finger without foreign body without damage to nail, initial encounter     Meds ordered this encounter  Medications   Tdap (BOOSTRIX) injection 0.5 mL    MDM:  Anthony Benson is a 46 y.o. male presenting for finger laceration status post actual dental cut with plastic strip.  On exam there was full range of motion no tendon injury no foreign body the wound is  clean and approximates well with Dermabond.  The finger is splinted follow-up instructions are provided.  Patient is agreeable to this plan.  I discussed todays findings, treatment plan, follow up and return instructions. Questions were answered. Patient/representative stated understanding of the instructions and patient is stable for discharge.  Leida Lauth FNP-C MSN    Hezzie Bump, NP 05/08/22 [phone]

## 2022-05-08 NOTE — Discharge Instructions (Addendum)
Leave wound clean and dry.  Do not get wet do not bend for 14 days use immobilizer.  Watch for signs of infection return for any new or worsening symptoms for reevaluation your tetanus was updated today

## 2022-05-08 NOTE — ED Triage Notes (Signed)
Pt here for some blade/saw device cutting left index finger. States last tetanus was 2018. Currently not bleeding. Less than 1.2 inch in length but can observe fatty tissue in depth.

## 2022-05-23 ENCOUNTER — Other Ambulatory Visit: Payer: Self-pay | Admitting: Internal Medicine

## 2022-07-10 ENCOUNTER — Ambulatory Visit
Admission: EM | Admit: 2022-07-10 | Discharge: 2022-07-10 | Disposition: A | Discharge status: Home / Self Care | Payer: BC Managed Care – PPO | Attending: Physician Assistant | Admitting: Physician Assistant

## 2022-07-10 DIAGNOSIS — H00011 Hordeolum externum right upper eyelid: Secondary | ICD-10-CM | POA: Diagnosis not present

## 2022-07-10 MED ORDER — POLYMYXIN B-TRIMETHOPRIM 10000-0.1 UNIT/ML-% OP SOLN
2.0000 [drp] | OPHTHALMIC | 0 refills | Status: DC
Start: 2022-07-10 — End: 2022-11-03

## 2022-07-10 NOTE — ED Provider Notes (Signed)
[facility]    CSN: [id number] Arrival date & time: 07/10/22  0830      History   Chief Complaint Chief Complaint  Patient presents with   Conjunctivitis    HPI Anthony Benson is a 46 y.o. male.   46 year old male presents with right eye irritation and swelling.  Patient relates that his daughter did have pinkeye about 2 weeks ago.  Patient indicates also that on Sunday he was working with some materials outside and thought that he might have got something in his eye.  He relates that over the past 2 days he has been having the right upper lid swelling and tenderness, intermittent blurred vision.  Patient relates that he has had some mild discharge from the eye, and this morning woke up with the eye being crusted.  Patient indicates that he has noticed some redness of the upper eyelid along with swelling and some mild white dots on the lid margin.  Patient denies any trauma to the right eye.  Patient indicates he has not had any fever or chills, and no cough or congestion.   Conjunctivitis    Past Medical History:  Diagnosis Date   Allergy    Barrett's esophagus    GERD (gastroesophageal reflux disease)    OTC -prn   Labral tear of shoulder left shoulder   PONV (postoperative nausea and vomiting)    mild    There are no problems to display for this patient.   Past Surgical History:  Procedure Laterality Date   ADENOIDECTOMY  1980   KNEE ARTHROSCOPY Left    x 3   SHOULDER ARTHROSCOPY  03/09/2012   Procedure: ARTHROSCOPY SHOULDER;  Surgeon: Johnn Hai, MD;  Location: [facility];  Service: Orthopedics;  Laterality: Left;  left shoulder scope exam under anes subarcomial decompression and mini open rotator cuff repair   ower       Home Medications    Prior to Admission medications   Medication Sig Start Date End Date Taking? Authorizing Provider  trimethoprim-polymyxin b (POLYTRIM) ophthalmic solution Place 2 drops into the right  eye every 4 (four) hours. 07/10/22  Yes Nyoka Lint, PA-C  Ibuprofen 200 MG CAPS Advil    [provider]  Multiple Vitamin (MULTIVITAMIN) tablet Take 1 tablet by mouth daily.    [provider]  omeprazole (PRILOSEC) 40 MG capsule TAKE 1 CAPSULE DAILY 05/23/22   Irene Shipper, MD    Family History Family History  Problem Relation Age of Onset   Diabetes Father    Barrett's esophagus Father 61   Colon cancer Neg Hx    Colon polyps Neg Hx    Esophageal cancer Neg Hx    Rectal cancer Neg Hx    Stomach cancer Neg Hx     Social History Social History   Tobacco Use   Smoking status: Never   Smokeless tobacco: Never  Substance Use Topics   Alcohol use: Yes    Alcohol/week: 0.0 standard drinks of alcohol    Comment: 2 monthly   Drug use: No     Allergies   Other and Penicillins   Review of Systems Review of Systems  Eyes:  Positive for discharge (right) and redness (right upper lid swelling).     Physical Exam Triage Vital Signs ED Triage Vitals [07/10/22 0850]  Enc Vitals Group     BP (!) 135/96     Pulse Rate 96     Resp 16  Temp 98 F (36.7 C)     Temp Source Oral     SpO2 96 %     Weight      Height      Head Circumference      Peak Flow      Pain Score 3     Pain Loc      Pain Edu?      Excl. in [place]?    No data found.  Updated Vital Signs BP (!) 135/96 (BP Location: Right Arm)   Pulse 96   Temp 98 F (36.7 C) (Oral)   Resp 16   SpO2 96%   Visual Acuity Right Eye Distance:   Left Eye Distance:   Bilateral Distance:    Right Eye Near:   Left Eye Near:    Bilateral Near:     Physical Exam Constitutional:      Appearance: Normal appearance.  HENT:     Right Ear: Tympanic membrane and ear canal normal.     Left Ear: Tympanic membrane and ear canal normal.     Mouth/Throat:     Mouth: Mucous membranes are moist.     Pharynx: Oropharynx is clear.  Eyes:     Comments: Eyes: Conjunctiva bilaterally is normal without  redness. Right upper eyelid with mild redness, swelling 1+, lid eversion reveals a internal stye present with some mild drainage.  There is no evidence of foreign body present.  EOMI, PERRLA.  Lymphadenopathy:     Cervical: No cervical adenopathy.  Neurological:     Mental Status: He is alert.      UC Treatments / Results  Labs (all labs ordered are listed, but only abnormal results are displayed) Labs Reviewed - No data to display  EKG   Radiology No results found.  Procedures Procedures (including critical care time)  Medications Ordered in UC Medications - No data to display  Initial Impression / Assessment and Plan / UC Course  I have reviewed the triage vital signs and the nursing notes.  Pertinent labs & imaging results that were available during my care of the patient were reviewed by me and considered in my medical decision making (see chart for details).       Plan: 1.  Advised use of Polytrim eyedrops, 2 drops every 6 hours until the condition clears. 2.  Advised to wash hands frequently to prevent of infection. 3.  Advised to follow-up PCP or return to urgent care if symptoms fail to improve. Final Clinical Impressions(s) / UC Diagnoses   Final diagnoses:  Hordeolum externum of right upper eyelid     Discharge Instructions      Advised to use warm compresses frequently to the area to help reduce the swelling and encourage drainage. Advise use of Polytrim eyedrops 2 drops in the eye every 6 hours to treat the infection. Advised to follow-up with PCP or return to urgent care if symptoms fail to improve over the next several days. Advise use sunglasses to help prevent irritation to the eyes from the sunlight.     ED Prescriptions     Medication Sig Dispense Auth. Provider   trimethoprim-polymyxin b (POLYTRIM) ophthalmic solution Place 2 drops into the right eye every 4 (four) hours. 10 mL Nyoka Lint, PA-C      PDMP not reviewed this  encounter.   Nyoka Lint, PA-C 07/10/22 [phone]

## 2022-07-10 NOTE — Discharge Instructions (Signed)
Advised to use warm compresses frequently to the area to help reduce the swelling and encourage drainage. Advise use of Polytrim eyedrops 2 drops in the eye every 6 hours to treat the infection. Advised to follow-up with PCP or return to urgent care if symptoms fail to improve over the next several days. Advise use sunglasses to help prevent irritation to the eyes from the sunlight.

## 2022-07-10 NOTE — ED Triage Notes (Signed)
Pt c/o right conjunctivitis since ~ Monday that now has crust/discharge. Recent family member exposure to pink eye

## 2022-08-09 ENCOUNTER — Ambulatory Visit: Payer: BC Managed Care – PPO | Admitting: Nurse Practitioner

## 2022-08-16 ENCOUNTER — Other Ambulatory Visit: Payer: Self-pay | Admitting: Internal Medicine

## 2022-10-16 ENCOUNTER — Ambulatory Visit: Payer: BC Managed Care – PPO | Admitting: Nurse Practitioner

## 2022-10-16 ENCOUNTER — Encounter: Payer: Self-pay | Admitting: Nurse Practitioner

## 2022-10-16 VITALS — BP 127/91 | HR 86 | Resp 18 | Ht 66.14 in | Wt 177.0 lb

## 2022-10-16 DIAGNOSIS — Z6828 Body mass index (BMI) 28.0-28.9, adult: Secondary | ICD-10-CM | POA: Diagnosis not present

## 2022-10-16 DIAGNOSIS — Z23 Encounter for immunization: Secondary | ICD-10-CM | POA: Diagnosis not present

## 2022-10-16 DIAGNOSIS — K219 Gastro-esophageal reflux disease without esophagitis: Secondary | ICD-10-CM | POA: Diagnosis not present

## 2022-10-16 DIAGNOSIS — Z7689 Persons encountering health services in other specified circumstances: Secondary | ICD-10-CM | POA: Diagnosis not present

## 2022-10-16 NOTE — Progress Notes (Signed)
New Patient Office Visit  Subjective    Patient ID: Moe Graca, male    DOB: December 01, 1975  Age: 46 y.o. MRN: [id number]  CC:  Chief Complaint  Patient presents with   Establish Care    HPI Dexton Horiuchi presents to establish care The patient has not hada primary care provider in a long time  -does see GI provider due to Barrett's esophagus and GERD.  -overdue to have CPE and routine, fasting labs  --family history of DM2   -He denies chest pain, chest pressure, or shortness of breath. He denies headaches or visual disturbances. He denies abdominal pain, nausea, vomiting, or changes in bowel or bladder habits.    Outpatient Encounter Medications as of 10/16/2022  Medication Sig   Ibuprofen 200 MG CAPS Advil   Multiple Vitamin (MULTIVITAMIN) tablet Take 1 tablet by mouth daily.   omeprazole (PRILOSEC) 40 MG capsule TAKE 1 CAPSULE DAILY   trimethoprim-polymyxin b (POLYTRIM) ophthalmic solution Place 2 drops into the right eye every 4 (four) hours.   No facility-administered encounter medications on file as of 10/16/2022.    Past Medical History:  Diagnosis Date   Allergy    Barrett's esophagus    GERD (gastroesophageal reflux disease)    OTC -prn   Labral tear of shoulder left shoulder   PONV (postoperative nausea and vomiting)    mild    Past Surgical History:  Procedure Laterality Date   ADENOIDECTOMY  1980   KNEE ARTHROSCOPY Left    x 3   SHOULDER ARTHROSCOPY  03/09/2012   Procedure: ARTHROSCOPY SHOULDER;  Surgeon: Johnn Hai, MD;  Location: [place];  Service: Orthopedics;  Laterality: Left;  left shoulder scope exam under anes subarcomial decompression and mini open rotator cuff repair   ower    Family History  Problem Relation Age of Onset   Diabetes Father    Barrett's esophagus Father 78   Colon cancer Neg Hx    Colon polyps Neg Hx    Esophageal cancer Neg Hx    Rectal cancer Neg Hx    Stomach cancer Neg Hx     Social  History   Socioeconomic History   Marital status: Married    Spouse name: Not on file   Number of children: 2   Years of education: Not on file   Highest education level: Not on file  Occupational History   Occupation: Photographer  Tobacco Use   Smoking status: Never   Smokeless tobacco: Never  Substance and Sexual Activity   Alcohol use: Yes    Alcohol/week: 0.0 standard drinks of alcohol    Comment: 2 monthly   Drug use: No   Sexual activity: Not on file  Other Topics Concern   Not on file  Social History Narrative   Not on file   Social Determinants of Health   Financial Resource Strain: Not on file  Food Insecurity: Not on file  Transportation Needs: Not on file  Physical Activity: Not on file  Stress: Not on file  Social Connections: Not on file  Intimate Partner Violence: Not on file    Review of Systems  Constitutional:  Negative for chills, fever and malaise/fatigue.  HENT:  Negative for congestion, sinus pain and sore throat.   Eyes: Negative.   Respiratory:  Negative for cough, shortness of breath and wheezing.   Cardiovascular:  Negative for chest pain, palpitations and leg swelling.  Gastrointestinal:  Negative for constipation, diarrhea, nausea and vomiting.  History of GERD with heartburn   Genitourinary: Negative.   Musculoskeletal:  Negative for myalgias.  Skin: Negative.   Neurological:  Negative for dizziness and headaches.  Endo/Heme/Allergies:  Does not bruise/bleed easily.  Psychiatric/Behavioral:  Negative for depression. The patient is not nervous/anxious.         Objective    Today's Vitals   10/16/22 1002  BP: (Abnormal) 127/91  Pulse: 86  Resp: 18  SpO2: 98%  Weight: 177 lb (80.3 kg)  Height: 5' 6.14" (1.68 m)   Body mass index is 28.45 kg/m.   Physical Exam Vitals and nursing note reviewed.  Constitutional:      Appearance: Normal appearance. He is well-developed.  HENT:     Head: Normocephalic and  atraumatic.     Nose: Nose normal.     Mouth/Throat:     Mouth: Mucous membranes are moist.     Pharynx: Oropharynx is clear.  Eyes:     Extraocular Movements: Extraocular movements intact.     Conjunctiva/sclera: Conjunctivae normal.     Pupils: Pupils are equal, round, and reactive to light.  Cardiovascular:     Rate and Rhythm: Normal rate and regular rhythm.     Pulses: Normal pulses.     Heart sounds: Normal heart sounds.  Pulmonary:     Effort: Pulmonary effort is normal.     Breath sounds: Normal breath sounds.  Abdominal:     Palpations: Abdomen is soft.  Musculoskeletal:        General: Normal range of motion.     Cervical back: Normal range of motion and neck supple.  Lymphadenopathy:     Cervical: No cervical adenopathy.  Skin:    General: Skin is warm and dry.     Capillary Refill: Capillary refill takes less than 2 seconds.  Neurological:     General: No focal deficit present.     Mental Status: He is alert and oriented to person, place, and time.  Psychiatric:        Mood and Affect: Mood normal.        Behavior: Behavior normal.        Thought Content: Thought content normal.        Judgment: Judgment normal.      Assessment & Plan:  1. Gastroesophageal reflux disease without esophagitis Continue omeprazole as prescribed. Follow up with GI provider as scheduled.   2. BMI 28.0-28.9,adult Discussed lowering calorie intake to 1500 calories per day and incorporating exercise into daily routine to help lose weight.   3. Need for influenza vaccination Flu vaccine administered during today's visit.  - Flu Vaccine QUAD 6+ mos PF IM (Fluarix Quad PF)  4. Encounter to establish care Appointment today to establish new primary care provider      Problem List Items Addressed This Visit       Digestive   Gastroesophageal reflux disease without esophagitis - Primary     Other   BMI 28.0-28.9,adult   Other Visit Diagnoses     Need for influenza  vaccination       Relevant Orders   Flu Vaccine QUAD 6+ mos PF IM (Fluarix Quad PF) (Completed)   Encounter to establish care           Return in about 4 months (around 02/15/2023) for health maintenance exam - FBW needed in next few days/weeks.   Ronnell Freshwater, NP

## 2022-10-23 ENCOUNTER — Other Ambulatory Visit: Payer: Self-pay

## 2022-10-23 DIAGNOSIS — Z0000 Encounter for general adult medical examination without abnormal findings: Secondary | ICD-10-CM

## 2022-10-23 DIAGNOSIS — Z130 Encounter for screening for diseases of the blood and blood-forming organs and certain disorders involving the immune mechanism: Secondary | ICD-10-CM

## 2022-11-01 ENCOUNTER — Other Ambulatory Visit: Payer: BC Managed Care – PPO

## 2022-11-01 DIAGNOSIS — Z0000 Encounter for general adult medical examination without abnormal findings: Secondary | ICD-10-CM | POA: Diagnosis not present

## 2022-11-01 DIAGNOSIS — Z13228 Encounter for screening for other metabolic disorders: Secondary | ICD-10-CM | POA: Diagnosis not present

## 2022-11-01 DIAGNOSIS — Z1321 Encounter for screening for nutritional disorder: Secondary | ICD-10-CM | POA: Diagnosis not present

## 2022-11-01 DIAGNOSIS — Z130 Encounter for screening for diseases of the blood and blood-forming organs and certain disorders involving the immune mechanism: Secondary | ICD-10-CM

## 2022-11-01 DIAGNOSIS — Z1329 Encounter for screening for other suspected endocrine disorder: Secondary | ICD-10-CM | POA: Diagnosis not present

## 2022-11-03 DIAGNOSIS — Z6828 Body mass index (BMI) 28.0-28.9, adult: Secondary | ICD-10-CM | POA: Insufficient documentation

## 2022-11-03 DIAGNOSIS — K219 Gastro-esophageal reflux disease without esophagitis: Secondary | ICD-10-CM | POA: Insufficient documentation

## 2022-11-04 LAB — COMPREHENSIVE METABOLIC PANEL
ALT: 39 IU/L (ref 0–44)
AST: 24 IU/L (ref 0–40)
Albumin/Globulin Ratio: 1.5 (ref 1.2–2.2)
Albumin: 4.6 g/dL (ref 4.1–5.1)
Alkaline Phosphatase: 87 IU/L (ref 44–121)
BUN/Creatinine Ratio: 13 (ref 9–20)
BUN: 13 mg/dL (ref 6–24)
Bilirubin Total: 0.4 mg/dL (ref 0.0–1.2)
CO2: 23 mmol/L (ref 20–29)
Calcium: 9.7 mg/dL (ref 8.7–10.2)
Chloride: 98 mmol/L (ref 96–106)
Creatinine, Ser: 1.01 mg/dL (ref 0.76–1.27)
Globulin, Total: 3.1 g/dL (ref 1.5–4.5)
Glucose: 312 mg/dL — ABNORMAL HIGH (ref 70–99)
Potassium: 4.2 mmol/L (ref 3.5–5.2)
Sodium: 139 mmol/L (ref 134–144)
Total Protein: 7.7 g/dL (ref 6.0–8.5)
eGFR: 92 mL/min/{1.73_m2} (ref >59)

## 2022-11-04 LAB — CBC
Hematocrit: 52.1 % — ABNORMAL HIGH (ref 37.5–51.0)
Hemoglobin: 16.6 g/dL (ref 13.0–17.7)
MCH: 26.8 pg (ref 26.6–33.0)
MCHC: 31.9 g/dL (ref 31.5–35.7)
MCV: 84 fL (ref 79–97)
Platelets: 280 10*3/uL (ref 150–450)
RBC: 6.19 x10E6/uL — ABNORMAL HIGH (ref 4.14–5.80)
RDW: 12.3 % (ref 11.6–15.4)
WBC: 6.8 10*3/uL (ref 3.4–10.8)

## 2022-11-04 LAB — LIPID PANEL
Chol/HDL Ratio: 5.2 ratio — ABNORMAL HIGH (ref 0.0–5.0)
Cholesterol, Total: 232 mg/dL — ABNORMAL HIGH (ref 100–199)
HDL: 45 mg/dL (ref >39)
LDL Chol Calc (NIH): 150 mg/dL — ABNORMAL HIGH (ref 0–99)
Triglycerides: 205 mg/dL — ABNORMAL HIGH (ref 0–149)
VLDL Cholesterol Cal: 37 mg/dL (ref 5–40)

## 2022-11-04 LAB — HEMOGLOBIN A1C
Est. average glucose Bld gHb Est-mCnc: 381 mg/dL
Hgb A1c MFr Bld: 14.9 % — ABNORMAL HIGH (ref 4.8–5.6)

## 2022-11-04 LAB — TSH: TSH: 2.62 u[IU]/mL (ref 0.450–4.500)

## 2022-11-07 ENCOUNTER — Other Ambulatory Visit: Payer: Self-pay | Admitting: Internal Medicine

## 2023-01-27 DIAGNOSIS — E119 Type 2 diabetes mellitus without complications: Secondary | ICD-10-CM

## 2023-01-27 HISTORY — DX: Type 2 diabetes mellitus without complications: E11.9

## 2023-02-19 ENCOUNTER — Encounter: Payer: Self-pay | Admitting: Nurse Practitioner

## 2023-02-19 ENCOUNTER — Ambulatory Visit (INDEPENDENT_AMBULATORY_CARE_PROVIDER_SITE_OTHER): Payer: BC Managed Care – PPO | Admitting: Nurse Practitioner

## 2023-02-19 VITALS — BP 125/83 | HR 91 | Ht 66.14 in | Wt 177.8 lb

## 2023-02-19 DIAGNOSIS — E785 Hyperlipidemia, unspecified: Secondary | ICD-10-CM

## 2023-02-19 DIAGNOSIS — E119 Type 2 diabetes mellitus without complications: Secondary | ICD-10-CM | POA: Diagnosis not present

## 2023-02-19 DIAGNOSIS — Z0001 Encounter for general adult medical examination with abnormal findings: Secondary | ICD-10-CM

## 2023-02-19 DIAGNOSIS — Z6828 Body mass index (BMI) 28.0-28.9, adult: Secondary | ICD-10-CM

## 2023-02-19 MED ORDER — METFORMIN HCL 500 MG PO TABS
500.0000 mg | ORAL_TABLET | Freq: Two times a day (BID) | ORAL | 1 refills | Status: DC
Start: 2023-02-19 — End: 2023-04-28

## 2023-02-19 NOTE — Progress Notes (Signed)
Complete physical exam   Patient: Marquies Gratz   DOB: [birth year]   47 y.o. Male  MRN: [id number] Visit Date: 02/19/2023    Chief Complaint  Patient presents with   Annual Exam   Subjective    Samvel Neels is a 47 y.o. male who presents today for a complete physical exam.  He reports consuming a  somewhat healthy  diet. Home exercise routine includes treadmill and stationary bike. He generally feels well. He does have additional problems to discuss today.  States that left knee has always been problematic. Has had three surgeries on the left knee.  -sees Dr. Jillyn Hidden   Knee Pain  The incident occurred more than 1 week ago. The incident occurred at home. The injury mechanism was a direct blow. The pain is present in the left knee (had accidental blow to lateral aspect of left knee.). The quality of the pain is described as cramping. The pain is at a severity of 7/10. The pain has been Fluctuating since onset. He reports no foreign bodies present. Exacerbated by: sitting bothers his knee more than standing and bearig weight. He has tried NSAIDs, ice and immobilization for the symptoms. The treatment provided mild relief.    Routine, fasting labs done  -glucose 312 with Hgba1c 14.2 --not currently diagnosed is diabetic  -mild, generalized elevation of lipid panel.  -other labs good.   -He denies chest pain, chest pressure, or shortness of breath. He denies headaches or visual disturbances. He denies abdominal pain, nausea, vomiting, or changes in bowel or bladder habits.  He denies excess thirst, though he does drink a great deal of water. He denies urinary frequency or urgency.   Past Medical History:  Diagnosis Date   Allergy    Barrett's esophagus    GERD (gastroesophageal reflux disease)    OTC -prn   Labral tear of shoulder left shoulder   PONV (postoperative nausea and vomiting)    mild   Past Surgical History:  Procedure Laterality Date   ADENOIDECTOMY  1980   KNEE  ARTHROSCOPY Left    x 3   SHOULDER ARTHROSCOPY  03/09/2012   Procedure: ARTHROSCOPY SHOULDER;  Surgeon: Javier Docker, MD;  Location: [facility];  Service: Orthopedics;  Laterality: Left;  left shoulder scope exam under anes subarcomial decompression and mini open rotator cuff repair   ower   Social History   Socioeconomic History   Marital status: Married    Spouse name: Not on file   Number of children: 2   Years of education: Not on file   Highest education level: Not on file  Occupational History   Occupation: Pharmacist, community  Tobacco Use   Smoking status: Never   Smokeless tobacco: Never  Vaping Use   Vaping Use: Never used  Substance and Sexual Activity   Alcohol use: Yes    Alcohol/week: 0.0 standard drinks of alcohol    Comment: 2 monthly   Drug use: No   Sexual activity: Not on file  Other Topics Concern   Not on file  Social History Narrative   Not on file   Social Determinants of Health   Financial Resource Strain: Not on file  Food Insecurity: Not on file  Transportation Needs: Not on file  Physical Activity: Not on file  Stress: Not on file  Social Connections: Not on file  Intimate Partner Violence: Not on file   Family Status  Relation Name Status   Father  (Not Specified)  Neg Hx  (Not Specified)   Family History  Problem Relation Age of Onset   Diabetes Father    Barrett's esophagus Father 39   Colon cancer Neg Hx    Colon polyps Neg Hx    Esophageal cancer Neg Hx    Rectal cancer Neg Hx    Stomach cancer Neg Hx    Allergies  Allergen Reactions   Other Anaphylaxis    [place] Nuts   Penicillins Anaphylaxis    Has patient had a PCN reaction causing immediate rash, facial/tongue/throat swelling, SOB or lightheadedness with hypotension: Yes  Has patient had a PCN reaction causing severe rash involving mucus membranes or skin necrosis: Yes  Has patient had a PCN reaction that required hospitalization No  Has patient  had a PCN reaction occurring within the last 10 years: No  If all of the above answers are "NO", then may proceed with Cephalosporin use.   Cefaclor Other (See Comments)    Patient Care Team: Carlean Jews, NP as PCP - General (Family Medicine)   Medications: Outpatient Medications Prior to Visit  Medication Sig   Ibuprofen 200 MG CAPS Advil   Multiple Vitamin (MULTIVITAMIN) tablet Take 1 tablet by mouth daily.   omeprazole (PRILOSEC) 40 MG capsule TAKE 1 CAPSULE DAILY   No facility-administered medications prior to visit.    Review of Systems See HPI    Last CBC Lab Results  Component Value Date   WBC 6.8 11/01/2022   HGB 16.6 11/01/2022   HCT 52.1 (H) 11/01/2022   MCV 84 11/01/2022   MCH 26.8 11/01/2022   RDW 12.3 11/01/2022   PLT 280 11/01/2022   Last metabolic panel Lab Results  Component Value Date   GLUCOSE 227 (H) 02/25/2023   NA 139 02/25/2023   K 4.6 02/25/2023   CL 102 02/25/2023   CO2 20 02/25/2023   BUN 14 02/25/2023   CREATININE 0.89 02/25/2023   EGFR 105 02/25/2023   CALCIUM 9.6 02/25/2023   PROT 7.7 11/01/2022   ALBUMIN 4.6 11/01/2022   LABGLOB 3.1 11/01/2022   AGRATIO 1.5 11/01/2022   BILITOT 0.4 11/01/2022   ALKPHOS 87 11/01/2022   AST 24 11/01/2022   ALT 39 11/01/2022   Last lipids Lab Results  Component Value Date   CHOL 232 (H) 11/01/2022   HDL 45 11/01/2022   LDLCALC 150 (H) 11/01/2022   TRIG 205 (H) 11/01/2022   CHOLHDL 5.2 (H) 11/01/2022   Last hemoglobin A1c Lab Results  Component Value Date   HGBA1C 13.4 (H) 02/25/2023   Last thyroid functions Lab Results  Component Value Date   TSH 2.620 11/01/2022       Objective     Today's Vitals   02/19/23 0834  BP: 125/83  Pulse: 91  SpO2: 97%  Weight: 177 lb 12.8 oz (80.6 kg)  Height: 5' 6.14" (1.68 m)   Body mass index is 28.58 kg/m.  BP Readings from Last 3 Encounters:  03/11/23 120/71  02/19/23 125/83  10/16/22 (Abnormal) 127/91    Wt Readings from Last  3 Encounters:  02/19/23 177 lb 12.8 oz (80.6 kg)  10/16/22 177 lb (80.3 kg)  05/25/21 183 lb (83 kg)     Physical Exam Vitals and nursing note reviewed.  Constitutional:      Appearance: Normal appearance. He is well-developed.  HENT:     Head: Normocephalic and atraumatic.     Right Ear: Tympanic membrane, ear canal and external ear normal.  Left Ear: Tympanic membrane, ear canal and external ear normal.     Nose: Nose normal.     Mouth/Throat:     Mouth: Mucous membranes are moist.     Pharynx: Oropharynx is clear.  Eyes:     Extraocular Movements: Extraocular movements intact.     Conjunctiva/sclera: Conjunctivae normal.     Pupils: Pupils are equal, round, and reactive to light.  Neck:     Vascular: No carotid bruit.  Cardiovascular:     Rate and Rhythm: Normal rate and regular rhythm.     Pulses: Normal pulses.     Heart sounds: Normal heart sounds.  Pulmonary:     Effort: Pulmonary effort is normal.     Breath sounds: Normal breath sounds.  Abdominal:     General: Bowel sounds are normal. There is no distension.     Palpations: Abdomen is soft. There is no mass.     Tenderness: There is no abdominal tenderness. There is no right CVA tenderness, left CVA tenderness, guarding or rebound.     Hernia: No hernia is present.  Musculoskeletal:        General: Normal range of motion.     Cervical back: Normal range of motion and neck supple.  Lymphadenopathy:     Cervical: No cervical adenopathy.  Skin:    General: Skin is warm and dry.     Capillary Refill: Capillary refill takes less than 2 seconds.  Neurological:     General: No focal deficit present.     Mental Status: He is alert and oriented to person, place, and time.  Psychiatric:        Mood and Affect: Mood normal.        Behavior: Behavior normal.        Thought Content: Thought content normal.        Judgment: Judgment normal.      Last depression screening scores   Row Labels 02/19/2023    8:36  AM  PHQ 2/9 Scores   Section Header. No data exists in this row.   PHQ - 2 Score   0  PHQ- 9 Score   1   Last fall risk screening   Row Labels 02/19/2023    8:36 AM  Fall Risk    Section Header. No data exists in this row.   Falls in the past year?   0  Number falls in past yr:   0  Injury with Fall?   0  Risk for fall due to :   No Fall Risks  Follow up   Falls evaluation completed     Assessment & Plan    Encounter for general adult medical examination with abnormal findings  New onset type 2 diabetes mellitus (HCC) Assessment & Plan: Start metformin 500 mg twice daily  -recheck HgbA1c for verification  -Dietary information to help lower blood sugars was given to  the patient at end of visit  Follow up to be scheduled in 3 months and recheck Hgba1c  Orders: -     metFORMIN HCl; Take 1 tablet (500 mg total) by mouth 2 (two) times daily with a meal.  Dispense: 180 tablet; Refill: 1  Hyperlipidemia LDL goal <100 Assessment & Plan: Recent lipid panel -   Lipid Panel     Component Value Date/Time   CHOL 232 (H) 11/01/2022 0842   TRIG 205 (H) 11/01/2022 0842   HDL 45 11/01/2022 0842   CHOLHDL 5.2 (H) 11/01/2022 4401  LDLCALC 150 (H) 11/01/2022 0842   LABVLDL 37 11/01/2022 0842    Recheck fasting lipids prior to next visit and treat as indicated.    BMI 28.0-28.9,adult Assessment & Plan: Discussed lowering calorie intake to 1500 calories per day and incorporating exercise into daily routine to help lose weight.       Immunization History  Administered Date(s) Administered   Influenza,inj,Quad PF,6+ Mos 10/16/2022   Influenza,inj,quad, With Preservative 07/28/2017   PFIZER(Purple Top)SARS-COV-2 Vaccination 01/07/2020, 01/28/2020   Tdap 05/08/2022    Health Maintenance  Topic Date Due   FOOT EXAM  Never done   OPHTHALMOLOGY EXAM  Never done   HIV Screening  Never done   Diabetic kidney evaluation - Urine ACR  Never done   Hepatitis C Screening  Never done    COLONOSCOPY (Pts 45-104yrs Insurance coverage will need to be confirmed)  Never done   COVID-19 Vaccine (3 - 2023-24 season) 06/28/2022   INFLUENZA VACCINE  05/29/2023   HEMOGLOBIN A1C  08/27/2023   Diabetic kidney evaluation - eGFR measurement  02/25/2024   DTaP/Tdap/Td (2 - Td or Tdap) 05/08/2032   HPV VACCINES  Aged Out    Discussed health benefits of physical activity, and encouraged him to engage in regular exercise appropriate for his age and condition.    Return in about 3 months (around 05/21/2023) for DM2, FBW a week prior to visit, check urine microalbumin at visit .        Carlean Jews, NP  [facility] [phone] (phone) [phone] (fax)  [facility]

## 2023-02-20 ENCOUNTER — Encounter: Payer: Self-pay | Admitting: Nurse Practitioner

## 2023-02-21 NOTE — Telephone Encounter (Signed)
Can you call patient to schedule lab appointment to check BMP and HgbA1c then a follow up with me a few days later.  Please and thank you.  -HB

## 2023-02-25 ENCOUNTER — Other Ambulatory Visit: Payer: BC Managed Care – PPO

## 2023-02-25 ENCOUNTER — Telehealth: Payer: Self-pay | Admitting: *Deleted

## 2023-02-25 DIAGNOSIS — E119 Type 2 diabetes mellitus without complications: Secondary | ICD-10-CM | POA: Diagnosis not present

## 2023-02-25 DIAGNOSIS — Z0000 Encounter for general adult medical examination without abnormal findings: Secondary | ICD-10-CM | POA: Diagnosis not present

## 2023-02-25 NOTE — Progress Notes (Signed)
Labs were discussed at visit. Recommended start of metformin. Patient asked to recheck Hgba1c prior to starting this medication. Will contact patient with new results when available.

## 2023-02-25 NOTE — Telephone Encounter (Signed)
Contacted pt to reschedule his appointment on 02/28/23 due to provider not being in office.  Pt states that he had labs drawn to determine if he should start Metformin.  Wanted to know if he would get those results before this appointment to determine if he should start the medication.

## 2023-02-26 LAB — BASIC METABOLIC PANEL
BUN/Creatinine Ratio: 16 (ref 9–20)
BUN: 14 mg/dL (ref 6–24)
CO2: 20 mmol/L (ref 20–29)
Calcium: 9.6 mg/dL (ref 8.7–10.2)
Chloride: 102 mmol/L (ref 96–106)
Creatinine, Ser: 0.89 mg/dL (ref 0.76–1.27)
Glucose: 227 mg/dL — ABNORMAL HIGH (ref 70–99)
Potassium: 4.6 mmol/L (ref 3.5–5.2)
Sodium: 139 mmol/L (ref 134–144)
eGFR: 105 mL/min/{1.73_m2} (ref >59)

## 2023-02-26 LAB — HEMOGLOBIN A1C
Est. average glucose Bld gHb Est-mCnc: 338 mg/dL
Hgb A1c MFr Bld: 13.4 % — ABNORMAL HIGH (ref 4.8–5.6)

## 2023-02-26 NOTE — Progress Notes (Signed)
Blood sugar a little better at 227. Still waiting for HgbA1c results.

## 2023-02-26 NOTE — Telephone Encounter (Signed)
He does not need to be seen on 5/3. Should give it to 3 months so we can recheck the HgbA1c.

## 2023-02-26 NOTE — Progress Notes (Signed)
Please let the patient know that his blood sugar and HgbA1c are just slightly better than before. His glucose was 227 with HgbA1c 13.4. I would like for him to start the metformin that we discussed at his visit. We should follow up like we discussed.  Thanks so much.   -HB

## 2023-02-26 NOTE — Telephone Encounter (Signed)
Informed pt of results and cancelled appointment on 03/12/23.   Pt is scheduled to follow up with [facility] labs on 05/13/23 and then the appointment 05/21/23, do we need to push this out a little later.  Last A1c was 02/25/23, so it is just about 15 days early for labs.  Please advise.

## 2023-02-27 NOTE — Telephone Encounter (Signed)
I would say push out to 7/30 or labs then a week later for appointment.

## 2023-02-27 NOTE — Telephone Encounter (Signed)
Contacted pt and pushed both appointments out to the 3 month mark for retesting A1c after starting medication.

## 2023-02-28 ENCOUNTER — Ambulatory Visit: Payer: BC Managed Care – PPO | Admitting: Nurse Practitioner

## 2023-03-11 ENCOUNTER — Ambulatory Visit
Admission: RE | Admit: 2023-03-11 | Discharge: 2023-03-11 | Disposition: A | Discharge status: Home / Self Care | Payer: BC Managed Care – PPO | Source: Ambulatory Visit | Attending: Internal Medicine | Admitting: Internal Medicine

## 2023-03-11 VITALS — BP 120/71 | HR 99 | Temp 98.4°F | Resp 18

## 2023-03-11 DIAGNOSIS — Z1152 Encounter for screening for COVID-19: Secondary | ICD-10-CM | POA: Diagnosis not present

## 2023-03-11 DIAGNOSIS — B9789 Other viral agents as the cause of diseases classified elsewhere: Secondary | ICD-10-CM | POA: Insufficient documentation

## 2023-03-11 DIAGNOSIS — J069 Acute upper respiratory infection, unspecified: Secondary | ICD-10-CM | POA: Diagnosis not present

## 2023-03-11 DIAGNOSIS — R0981 Nasal congestion: Secondary | ICD-10-CM | POA: Diagnosis not present

## 2023-03-11 MED ORDER — FLUTICASONE PROPIONATE 50 MCG/ACT NA SUSP: 1.0000 | Freq: Every day | NASAL | 0 refills | Status: DC

## 2023-03-11 MED ORDER — BENZONATATE 100 MG PO CAPS: 100.0000 mg | ORAL_CAPSULE | Freq: Three times a day (TID) | ORAL | 0 refills | Status: DC | PRN

## 2023-03-11 NOTE — ED Provider Notes (Signed)
[facility]    CSN: [id number] Arrival date & time: 03/11/23  1122      History   Chief Complaint Chief Complaint  Patient presents with   Nasal Congestion   Cough    HPI Darrly Merolla is a 47 y.o. male.   Patient presents with a 2 to 3-day history of cough and nasal congestion.  Reports that his daughter had similar symptoms.  Temp max at home was 99.8.  Patient has taken Tylenol and Sudafed with some improvement in symptoms.  Denies history of asthma or COPD and patient does not smoke cigarettes.  Denies chest pain, shortness of breath.    Cough   Past Medical History:  Diagnosis Date   Allergy    Barrett's esophagus    GERD (gastroesophageal reflux disease)    OTC -prn   Labral tear of shoulder left shoulder   PONV (postoperative nausea and vomiting)    mild    Patient Active Problem List   Diagnosis Date Noted   Gastroesophageal reflux disease without esophagitis 11/03/2022   BMI 28.0-28.9,adult 11/03/2022    Past Surgical History:  Procedure Laterality Date   ADENOIDECTOMY  1980   KNEE ARTHROSCOPY Left    x 3   SHOULDER ARTHROSCOPY  03/09/2012   Procedure: ARTHROSCOPY SHOULDER;  Surgeon: Javier Docker, MD;  Location: [facility];  Service: Orthopedics;  Laterality: Left;  left shoulder scope exam under anes subarcomial decompression and mini open rotator cuff repair   ower       Home Medications    Prior to Admission medications   Medication Sig Start Date End Date Taking? Authorizing Provider  benzonatate (TESSALON) 100 MG capsule Take 1 capsule (100 mg total) by mouth every 8 (eight) hours as needed for cough. 03/11/23  Yes Cregg Jutte, Rolly Salter E, FNP  fluticasone (FLONASE) 50 MCG/ACT nasal spray Place 1 spray into both nostrils daily. 03/11/23  Yes Agam Davenport, Acie Fredrickson, FNP  Ibuprofen 200 MG CAPS Advil    [provider]  metFORMIN (GLUCOPHAGE) 500 MG tablet Take 1 tablet (500 mg total) by mouth 2 (two) times daily with a  meal. 02/19/23   Carlean Jews, NP  Multiple Vitamin (MULTIVITAMIN) tablet Take 1 tablet by mouth daily.    [provider]  omeprazole (PRILOSEC) 40 MG capsule TAKE 1 CAPSULE DAILY 11/07/22   Hilarie Fredrickson, MD    Family History Family History  Problem Relation Age of Onset   Diabetes Father    Barrett's esophagus Father 44   Colon cancer Neg Hx    Colon polyps Neg Hx    Esophageal cancer Neg Hx    Rectal cancer Neg Hx    Stomach cancer Neg Hx     Social History Social History   Tobacco Use   Smoking status: Never   Smokeless tobacco: Never  Vaping Use   Vaping Use: Never used  Substance Use Topics   Alcohol use: Yes    Alcohol/week: 0.0 standard drinks of alcohol    Comment: 2 monthly   Drug use: No     Allergies   Other, Penicillins, and Cefaclor   Review of Systems Review of Systems Per HPI  Physical Exam Triage Vital Signs ED Triage Vitals [03/11/23 1147]  Enc Vitals Group     BP 120/71     Pulse Rate 99     Resp 18     Temp 98.4 F (36.9 C)     Temp Source Oral  SpO2 97 %     Weight      Height      Head Circumference      Peak Flow      Pain Score 0     Pain Loc      Pain Edu?      Excl. in GC?    No data found.  Updated Vital Signs BP 120/71 (BP Location: Left Arm)   Pulse 99   Temp 98.4 F (36.9 C) (Oral)   Resp 18   SpO2 97%   Visual Acuity Right Eye Distance:   Left Eye Distance:   Bilateral Distance:    Right Eye Near:   Left Eye Near:    Bilateral Near:     Physical Exam Constitutional:      General: He is not in acute distress.    Appearance: Normal appearance. He is not toxic-appearing or diaphoretic.  HENT:     Head: Normocephalic and atraumatic.     Right Ear: Tympanic membrane and ear canal normal.     Left Ear: Tympanic membrane and ear canal normal.     Nose: Congestion present.     Mouth/Throat:     Mouth: Mucous membranes are moist.     Pharynx: No posterior oropharyngeal erythema.  Eyes:      Extraocular Movements: Extraocular movements intact.     Conjunctiva/sclera: Conjunctivae normal.     Pupils: Pupils are equal, round, and reactive to light.  Cardiovascular:     Rate and Rhythm: Normal rate and regular rhythm.     Pulses: Normal pulses.     Heart sounds: Normal heart sounds.  Pulmonary:     Effort: Pulmonary effort is normal. No respiratory distress.     Breath sounds: Normal breath sounds. No stridor. No wheezing, rhonchi or rales.  Abdominal:     General: Abdomen is flat. Bowel sounds are normal.     Palpations: Abdomen is soft.  Musculoskeletal:        General: Normal range of motion.     Cervical back: Normal range of motion.  Skin:    General: Skin is warm and dry.  Neurological:     General: No focal deficit present.     Mental Status: He is alert and oriented to person, place, and time. Mental status is at baseline.  Psychiatric:        Mood and Affect: Mood normal.        Behavior: Behavior normal.      UC Treatments / Results  Labs (all labs ordered are listed, but only abnormal results are displayed) Labs Reviewed  SARS CORONAVIRUS 2 (TAT 6-24 HRS)    EKG   Radiology No results found.  Procedures Procedures (including critical care time)  Medications Ordered in UC Medications - No data to display  Initial Impression / Assessment and Plan / UC Course  I have reviewed the triage vital signs and the nursing notes.  Pertinent labs & imaging results that were available during my care of the patient were reviewed by me and considered in my medical decision making (see chart for details).     Patient presents with symptoms likely from a viral upper respiratory infection. Do not suspect underlying cardiopulmonary process. Symptoms seem unlikely related to ACS, CHF or COPD exacerbations, pneumonia, pneumothorax. Patient is nontoxic appearing and not in need of emergent medical intervention.  COVID test pending.  Recommended symptom control  with medications and supportive care.  Patient was sent Flonase  and benzonatate for cough.  Return if symptoms fail to improve in 1-2 weeks or you develop shortness of breath, chest pain, severe headache. Patient states understanding and is agreeable.  Discharged with PCP followup.  Final Clinical Impressions(s) / UC Diagnoses   Final diagnoses:  Viral upper respiratory tract infection with cough     Discharge Instructions      You have a viral illness that should run its course. I have prescribed two medications to help alleviate symptoms.  Covid test is pending.  Will call if it is positive.  Please follow-up if any symptoms persist or worsen.    ED Prescriptions     Medication Sig Dispense Auth. Provider   fluticasone (FLONASE) 50 MCG/ACT nasal spray Place 1 spray into both nostrils daily. 16 g Evella Kasal, Rolly Salter E, [place]   benzonatate (TESSALON) 100 MG capsule Take 1 capsule (100 mg total) by mouth every 8 (eight) hours as needed for cough. 21 capsule [place], Acie Fredrickson, [place]      PDMP not reviewed this encounter.   Gustavus Bryant, [place] 03/11/23 1224

## 2023-03-11 NOTE — ED Triage Notes (Signed)
Patient with c/o cough and congestion for a couple of days. Patient with yellow/white nasal drainage.

## 2023-03-11 NOTE — Discharge Instructions (Signed)
You have a viral illness that should run its course. I have prescribed two medications to help alleviate symptoms.  Covid test is pending.  Will call if it is positive.  Please follow-up if any symptoms persist or worsen.

## 2023-03-12 ENCOUNTER — Ambulatory Visit: Payer: BC Managed Care – PPO | Admitting: Nurse Practitioner

## 2023-03-12 LAB — SARS CORONAVIRUS 2 (TAT 6-24 HRS): SARS Coronavirus 2: NEGATIVE

## 2023-03-13 DIAGNOSIS — E785 Hyperlipidemia, unspecified: Secondary | ICD-10-CM | POA: Insufficient documentation

## 2023-03-13 DIAGNOSIS — E1169 Type 2 diabetes mellitus with other specified complication: Secondary | ICD-10-CM | POA: Insufficient documentation

## 2023-03-13 DIAGNOSIS — E119 Type 2 diabetes mellitus without complications: Secondary | ICD-10-CM | POA: Insufficient documentation

## 2023-03-13 NOTE — Assessment & Plan Note (Signed)
Discussed lowering calorie intake to 1500 calories per day and incorporating exercise into daily routine to help lose weight.  °

## 2023-03-13 NOTE — Assessment & Plan Note (Signed)
Start metformin 500 mg twice daily  -recheck HgbA1c for verification  -Dietary information to help lower blood sugars was given to  the patient at end of visit  Follow up to be scheduled in 3 months and recheck Hgba1c

## 2023-03-13 NOTE — Assessment & Plan Note (Signed)
Recent lipid panel -   Lipid Panel     Component Value Date/Time   CHOL 232 (H) 11/01/2022 0842   TRIG 205 (H) 11/01/2022 0842   HDL 45 11/01/2022 0842   CHOLHDL 5.2 (H) 11/01/2022 0842   LDLCALC 150 (H) 11/01/2022 0842   LABVLDL 37 11/01/2022 0842    Recheck fasting lipids prior to next visit and treat as indicated.

## 2023-04-28 ENCOUNTER — Telehealth: Payer: Self-pay | Admitting: *Deleted

## 2023-04-28 DIAGNOSIS — E119 Type 2 diabetes mellitus without complications: Secondary | ICD-10-CM

## 2023-04-28 MED ORDER — METFORMIN HCL 500 MG PO TABS: 500.0000 mg | ORAL_TABLET | Freq: Two times a day (BID) | ORAL | 1 refills | Status: DC

## 2023-04-28 NOTE — Telephone Encounter (Signed)
[facility]ill [facility] to [facility]  °

## 2023-04-28 NOTE — Telephone Encounter (Signed)
Pt calling to reschedule appointment due to provider leaving office.  He will need a refill on below before the appointment.  He will run out before appt.     LOV:02/19/23 ROV:06/04/23         metFORMIN (GLUCOPHAGE) 500 MG table   [facility] - [place], [place] - [address] [facility] AT Portal [facility] Phone: [phone]  Fax: [phone]

## 2023-05-11 ENCOUNTER — Other Ambulatory Visit: Payer: Self-pay | Admitting: Internal Medicine

## 2023-05-13 ENCOUNTER — Other Ambulatory Visit: Payer: BC Managed Care – PPO

## 2023-05-14 ENCOUNTER — Other Ambulatory Visit: Payer: Self-pay

## 2023-05-14 DIAGNOSIS — Z130 Encounter for screening for diseases of the blood and blood-forming organs and certain disorders involving the immune mechanism: Secondary | ICD-10-CM

## 2023-05-14 DIAGNOSIS — E785 Hyperlipidemia, unspecified: Secondary | ICD-10-CM

## 2023-05-14 DIAGNOSIS — Z0000 Encounter for general adult medical examination without abnormal findings: Secondary | ICD-10-CM

## 2023-05-16 ENCOUNTER — Telehealth: Payer: Self-pay | Admitting: Internal Medicine

## 2023-05-16 NOTE — Telephone Encounter (Signed)
Patient needs an office visit scheduled before I can refill his medication

## 2023-05-16 NOTE — Telephone Encounter (Signed)
PT is calling to get refill on omeprazole. Should be sent to [facility]

## 2023-05-21 ENCOUNTER — Ambulatory Visit: Payer: BC Managed Care – PPO | Admitting: Nurse Practitioner

## 2023-05-27 ENCOUNTER — Other Ambulatory Visit: Payer: BC Managed Care – PPO

## 2023-05-27 DIAGNOSIS — Z130 Encounter for screening for diseases of the blood and blood-forming organs and certain disorders involving the immune mechanism: Secondary | ICD-10-CM

## 2023-05-27 DIAGNOSIS — Z0000 Encounter for general adult medical examination without abnormal findings: Secondary | ICD-10-CM

## 2023-05-27 DIAGNOSIS — E785 Hyperlipidemia, unspecified: Secondary | ICD-10-CM

## 2023-05-29 ENCOUNTER — Telehealth: Payer: Self-pay | Admitting: Internal Medicine

## 2023-05-29 MED ORDER — OMEPRAZOLE 40 MG PO CPDR: DELAYED_RELEASE_CAPSULE | ORAL | 1 refills | Status: DC

## 2023-05-29 NOTE — Telephone Encounter (Signed)
Inbound call from patient requesting a refill for omeprazole medication. Is scheduled for 10/15. Please advise, thank you.

## 2023-05-29 NOTE — Telephone Encounter (Signed)
Omeprazole refilled.

## 2023-06-04 ENCOUNTER — Ambulatory Visit: Payer: BC Managed Care – PPO | Admitting: Family Medicine

## 2023-06-04 ENCOUNTER — Ambulatory Visit: Payer: BC Managed Care – PPO | Admitting: Nurse Practitioner

## 2023-06-04 ENCOUNTER — Encounter: Payer: Self-pay | Admitting: Family Medicine

## 2023-06-04 VITALS — BP 121/71 | HR 94 | Ht 66.14 in | Wt 180.1 lb

## 2023-06-04 DIAGNOSIS — E119 Type 2 diabetes mellitus without complications: Secondary | ICD-10-CM | POA: Diagnosis not present

## 2023-06-04 MED ORDER — METFORMIN HCL ER 750 MG PO TB24: 750.0000 mg | ORAL_TABLET | Freq: Two times a day (BID) | ORAL | 1 refills | Status: DC

## 2023-06-04 MED ORDER — PRAVASTATIN SODIUM 10 MG PO TABS: 10.0000 mg | ORAL_TABLET | Freq: Every day | ORAL | 1 refills | Status: DC

## 2023-06-04 NOTE — Progress Notes (Signed)
   Established Patient Office Visit  Subjective   Patient ID: Anthony Benson, male    DOB: Apr 10, 1976  Age: 47 y.o. MRN: [id number]  Chief Complaint  Patient presents with   Medical Management of Chronic Issues    HPI   Patient states that he had never been to a doctor prior to establishing with [place].  Never knew if he had diabetes or not.  But had never also checked for diabetes.  Father was diagnosed with diabetes when he was 45.  Believes it was type II.  Patient does not eat a significant amount of carbohydrates in his diet.  Avoids rice, eats Posta occasionally.  At his peak to drink about 1 soda and 1 glass of sweet tea a day.  Patient states that his heaviest he is about 200 pounds  We discussed statins.  Patient okay with starting a statin.  We discussed taking pravastatin and the side effects of statins to look out for.  We discussed increasing the patient's metformin to 750 mg and changing it to XR to help with his GI side effects.  Patient does not have an eye doctor.  Advised him to reach out to his insurance company and see who is covered by his insurance.  Does not need a referral for optometrist.   The 10-year ASCVD risk score (Arnett DK, et al., 2019) is: 5.2%  Health Maintenance Due  Topic Date Due   OPHTHALMOLOGY EXAM  Never done   HIV Screening  Never done   Diabetic kidney evaluation - Urine ACR  Never done   Hepatitis C Screening  Never done   Colonoscopy  Never done   COVID-19 Vaccine (3 - 2023-24 season) 06/28/2022   INFLUENZA VACCINE  05/29/2023      Objective:     BP 121/71   Pulse 94   Ht 5' 6.14" (1.68 m)   Wt 180 lb 1.9 oz (81.7 kg)   SpO2 98%   BMI 28.95 kg/m    Physical Exam General: Alert and oriented CV: Regular rate and rhythm Extremities: Normal foot exam.  Normal pulses, no ulcers, normal sensation.   No results found for any visits on 06/04/23.      Assessment & Plan:   New onset type 2 diabetes mellitus (HCC) Assessment &  Plan: Improved A1c - Follow-up antibody testing to help rule out type I given his lack of typical risk factors for type 2 diabetes - Changing metformin to 750 twice a day - Follow-up in 3 months - Started pravastatin  Orders: -     Microalbumin / creatinine urine ratio -     C-peptide -     Glutamic acid decarboxylase auto abs -     Anti-islet cell antibody -     Insulin antibodies, blood  Other orders -     Pravastatin Sodium; Take 1 tablet (10 mg total) by mouth daily.  Dispense: 90 tablet; Refill: 1 -     metFORMIN HCl ER; Take 1 tablet (750 mg total) by mouth 2 (two) times daily.  Dispense: 180 tablet; Refill: 1     Return in about 3 months (around 09/04/2023) for DM.    Sandre Kitty, MD

## 2023-06-04 NOTE — Assessment & Plan Note (Signed)
Improved A1c - Follow-up antibody testing to help rule out type I given his lack of typical risk factors for type 2 diabetes - Changing metformin to 750 twice a day - Follow-up in 3 months - Started pravastatin

## 2023-06-04 NOTE — Patient Instructions (Signed)
It was nice to see you today,  We addressed the following topics today: -I have ordered some antibody testing to help diagnose the type of diabetes you have - I have sent in metformin XR for you to take twice a day.  Take this instead of your current metformin - I have sent in a prescription for pravastatin.  Take this daily. - Follow-up in 3 months.  Have a great day,  Frederic Jericho, MD

## 2023-06-09 MED ORDER — OMEPRAZOLE 40 MG PO CPDR: DELAYED_RELEASE_CAPSULE | ORAL | 1 refills | Status: DC

## 2023-06-09 NOTE — Addendum Note (Signed)
Addended by: Jeanine Luz on: 06/09/2023 01:59 PM   Modules accepted: Orders

## 2023-06-09 NOTE — Telephone Encounter (Signed)
Please resend prescription refill for [facility]ep[facility].

## 2023-06-09 NOTE — Telephone Encounter (Signed)
Omeprazole sent to [facility] per patient's request

## 2023-08-11 NOTE — Progress Notes (Unsigned)
08/11/2023 Anthony Benson [id number] 03/24/1976   Chief Complaint:  History of Present Illness: Anthony Benson is a 47 year old male with a past medical history of GERD and Barrett's esophagus. He is known by Dr. Marina Goodell. He presents today      Latest Ref Rng & Units 05/27/2023    9:14 AM 11/01/2022    8:42 AM 03/09/2012   12:07 PM  CBC  WBC 3.4 - 10.8 x10E3/uL 5.3  6.8    Hemoglobin 13.0 - 17.7 g/dL 40.9  81.1  91.4   Hematocrit 37.5 - 51.0 % 49.1  52.1    Platelets 150 - 450 x10E3/uL 260  280         Latest Ref Rng & Units 05/27/2023    9:14 AM 02/25/2023   10:13 AM 11/01/2022    8:42 AM  CMP  Glucose 70 - 99 mg/dL 782  956  213   BUN 6 - 24 mg/dL 13  14  13    Creatinine 0.76 - 1.27 mg/dL 0.86  5.78  4.69   Sodium 134 - 144 mmol/L 140  139  139   Potassium 3.5 - 5.2 mmol/L 4.7  4.6  4.2   Chloride 96 - 106 mmol/L 103  102  98   CO2 20 - 29 mmol/L 21  20  23    Calcium 8.7 - 10.2 mg/dL 9.4  9.6  9.7   Total Protein 6.0 - 8.5 g/dL 7.6   7.7   Total Bilirubin 0.0 - 1.2 mg/dL 0.2   0.4   Alkaline Phos 44 - 121 IU/L 59   87   AST 0 - 40 IU/L 17   24   ALT 0 - 44 IU/L 20   39     EGD 05/25/2021: - Barrett's esophagus. Biopsied.  - Normal stomach. Hiatal hernia.  - Normal examined duodenum. - Recall EGD in 3 years  1. Surgical [P], distal esophagus - INTESTINAL METAPLASIA CONSISTENT WITH BARRETT'S ESOPHAGUS. - NO DYSPLASIA OR MALIGNANCY. 2. Surgical [P], mid esophagus - INTESTINAL METAPLASIA CONSISTENT WITH BARRETT'S ESOPHAGUS. - NO DYSPLASIA OR MALIGNANCY.   Past Medical History:  Diagnosis Date   Allergy    Barrett's esophagus    GERD (gastroesophageal reflux disease)    OTC -prn   Labral tear of shoulder left shoulder   PONV (postoperative nausea and vomiting)    mild   Past Surgical History:  Procedure Laterality Date   ADENOIDECTOMY  1980   KNEE ARTHROSCOPY Left    x 3   SHOULDER ARTHROSCOPY  03/09/2012   Procedure: ARTHROSCOPY SHOULDER;  Surgeon:  Javier Docker, MD;  Location: [facility] [place];  Service: Orthopedics;  Laterality: Left;  left shoulder scope exam under anes subarcomial decompression and mini open rotator cuff repair   ower       Current Medications, Allergies, Past Medical History, Past Surgical History, Family History and Social History were reviewed in [employer] record.   Review of Systems:   Constitutional: Negative for fever, sweats, chills or weight loss.  Respiratory: Negative for shortness of breath.   Cardiovascular: Negative for chest pain, palpitations and leg swelling.  Gastrointestinal: See HPI.  Musculoskeletal: Negative for back pain or muscle aches.  Neurological: Negative for dizziness, headaches or paresthesias.    Physical Exam: There were no vitals taken for this visit. General: in no acute distress. Head: Normocephalic and atraumatic. Eyes: No scleral icterus. Conjunctiva pink . Ears: Normal auditory acuity. Mouth: Dentition  intact. No ulcers or lesions.  Lungs: Clear throughout to auscultation. Heart: Regular rate and rhythm, no murmur. Abdomen: Soft, nontender and nondistended. No masses or hepatomegaly. Normal bowel sounds x 4 quadrants.  Rectal: Deferred.  Musculoskeletal: Symmetrical with no gross deformities. Extremities: No edema. Neurological: Alert oriented x 4. No focal deficits.  Psychological: Alert and cooperative. Normal mood and affect  Assessment and Recommendations:  47 year old male with GERD and Barrett's esophagus  -Surveillance EGD due 04/2024  Colon cancer screening  -Colonoscopy

## 2023-08-12 ENCOUNTER — Encounter: Payer: Self-pay | Admitting: Nurse Practitioner

## 2023-08-12 ENCOUNTER — Ambulatory Visit: Payer: BC Managed Care – PPO | Admitting: Nurse Practitioner

## 2023-08-12 VITALS — BP 120/84 | HR 91 | Ht 66.0 in | Wt 180.2 lb

## 2023-08-12 DIAGNOSIS — R140 Abdominal distension (gaseous): Secondary | ICD-10-CM

## 2023-08-12 DIAGNOSIS — K2270 Barrett's esophagus without dysplasia: Secondary | ICD-10-CM

## 2023-08-12 DIAGNOSIS — E119 Type 2 diabetes mellitus without complications: Secondary | ICD-10-CM

## 2023-08-12 DIAGNOSIS — K219 Gastro-esophageal reflux disease without esophagitis: Secondary | ICD-10-CM | POA: Diagnosis not present

## 2023-08-12 NOTE — Progress Notes (Signed)
Anthony Benson, please enter EGD and colonoscopy recall due September 2025 thank you

## 2023-08-12 NOTE — Patient Instructions (Addendum)
Continue GERD diet.  Continue Omeprazole 40 mg daily.  Take Lactaid 1-2 tablets with each dairy product.  Schedule EGD/Colonoscopy in Fall 2025.  Due to recent changes in healthcare laws, you may see the results of your imaging and laboratory studies on MyChart before your provider has had a chance to review them.  We understand that in some cases there may be results that are confusing or concerning to you. Not all laboratory results come back in the same time frame and the provider may be waiting for multiple results in order to interpret others.  Please give [place] 48 hours in order for your provider to thoroughly review all the results before contacting the office for clarification of your results.   Thank you for trusting me with your gastrointestinal care!   Alcide Evener, CRNP

## 2023-08-14 ENCOUNTER — Telehealth: Payer: Self-pay

## 2023-08-14 NOTE — Telephone Encounter (Signed)
Message Received: 2 days ago Arnaldo Natal, NP  Emeline Darling, RN      Previous Messages  Routed Note  Author: Arnaldo Natal, NP Service: Gastroenterology Author Type: Nurse Practitioner  Filed: 08/12/2023 12:18 PM Encounter Date: 08/12/2023 Status: Signed  Editor: Arnaldo Natal, NP (Nurse Practitioner)   Viviann Spare, please enter EGD and colonoscopy recall due September 2025 thank you     EGD and colonoscopy recall due September 2025 entered into EPIC.

## 2023-08-26 DIAGNOSIS — M25572 Pain in left ankle and joints of left foot: Secondary | ICD-10-CM | POA: Diagnosis not present

## 2023-08-28 ENCOUNTER — Telehealth: Payer: Self-pay

## 2023-08-28 ENCOUNTER — Other Ambulatory Visit: Payer: Self-pay | Admitting: Family Medicine

## 2023-08-28 DIAGNOSIS — E119 Type 2 diabetes mellitus without complications: Secondary | ICD-10-CM

## 2023-08-28 NOTE — Telephone Encounter (Signed)
Pt was calling to see if he needed to have labs before his upcoming appointment?

## 2023-08-28 NOTE — Telephone Encounter (Signed)
A1c and lipid panel orders placed.

## 2023-08-29 ENCOUNTER — Other Ambulatory Visit: Payer: BC Managed Care – PPO

## 2023-08-29 DIAGNOSIS — E119 Type 2 diabetes mellitus without complications: Secondary | ICD-10-CM

## 2023-08-30 LAB — LIPID PANEL
Chol/HDL Ratio: 3.5 ratio (ref 0.0–5.0)
Cholesterol, Total: 138 mg/dL (ref 100–199)
HDL: 39 mg/dL — ABNORMAL LOW (ref >39)
LDL Chol Calc (NIH): 82 mg/dL (ref 0–99)
Triglycerides: 86 mg/dL (ref 0–149)
VLDL Cholesterol Cal: 17 mg/dL (ref 5–40)

## 2023-08-30 LAB — HEMOGLOBIN A1C
Est. average glucose Bld gHb Est-mCnc: 180 mg/dL
Hgb A1c MFr Bld: 7.9 % — ABNORMAL HIGH (ref 4.8–5.6)

## 2023-09-04 ENCOUNTER — Ambulatory Visit: Payer: BC Managed Care – PPO | Admitting: Family Medicine

## 2023-09-04 VITALS — BP 121/84 | HR 95 | Ht 66.0 in | Wt 178.0 lb

## 2023-09-04 DIAGNOSIS — Z23 Encounter for immunization: Secondary | ICD-10-CM | POA: Diagnosis not present

## 2023-09-04 DIAGNOSIS — E785 Hyperlipidemia, unspecified: Secondary | ICD-10-CM

## 2023-09-04 DIAGNOSIS — E119 Type 2 diabetes mellitus without complications: Secondary | ICD-10-CM

## 2023-09-04 DIAGNOSIS — E1169 Type 2 diabetes mellitus with other specified complication: Secondary | ICD-10-CM

## 2023-09-04 MED ORDER — EMPAGLIFLOZIN 10 MG PO TABS: 10.0000 mg | ORAL_TABLET | Freq: Every day | ORAL | 1 refills | Status: DC

## 2023-09-04 NOTE — Patient Instructions (Signed)
It was nice to see you today,  We addressed the following topics today: -Your A1c was a little bit bette, it was 7.9.  Instead of increasing the metformin I would like to add a medicine called Jardiance.  I have included some information about this in the after visit summary - I will follow-up with you in 3 months - Your cholesterol is better.  Continue your statin.  Have a great day,  Frederic Jericho, MD

## 2023-09-04 NOTE — Assessment & Plan Note (Signed)
7.9 A1c.  Still above goal but improved.  Currently taking 750 mg twice daily metformin doubt he would get to goal with just adding 500 more milligrams of metformin, therefore we will prescribe Jardiance.  Recheck in 3 months.

## 2023-09-04 NOTE — Progress Notes (Signed)
   Established Patient Office Visit  Subjective   Patient ID: Anthony Benson, male    DOB: 1976/08/21  Age: 47 y.o. MRN: [id number]  Chief Complaint  Patient presents with   Medical Management of Chronic Issues    HPI Dm2-patient is taking his metformin.  Has no issues with this.  Has occasional upset stomach but attributes this to the sugar substitutes he uses.  Has been eating more foods with monk fruit sweetener.  Patient states he did have some pins and needle sensation for about 30 seconds in the right foot the other night.  This is the only time he has had the symptoms.  Hyperlipidemia-patient has no issues with the statin.  Discussed his cholesterol level.  No concerns or questions.  Patient recently fell and hurt his left ankle while hunting.  He has gone to [facility].  States he had a "bone chip".  Has follow-up with them on Tuesday.    The 10-year ASCVD risk score (Arnett DK, et al., 2019) is: 4.1%  Health Maintenance Due  Topic Date Due   OPHTHALMOLOGY EXAM  Never done   HIV Screening  Never done   Hepatitis C Screening  Never done   Colonoscopy  Never done   COVID-19 Vaccine (3 - 2023-24 season) 06/29/2023      Objective:     BP 121/84   Pulse 95   Ht 5\' 6"  (1.676 m)   Wt 178 lb (80.7 kg)   SpO2 98%   BMI 28.73 kg/m    Physical Exam General: Alert, oriented CV: Regular rhythm Pulmonary: Lungs clear bilaterally MSK: Left foot in a hard boot.   No results found for any visits on 09/04/23.      Assessment & Plan:   Type 2 diabetes mellitus without complication, without long-term current use of insulin (HCC) Assessment & Plan: 7.9 A1c.  Still above goal but improved.  Currently taking 750 mg twice daily metformin doubt he would get to goal with just adding 500 more milligrams of metformin, therefore we will prescribe Jardiance.  Recheck in 3 months.  Orders: -     Empagliflozin; Take 1 tablet (10 mg total) by mouth daily before breakfast.   Dispense: 90 tablet; Refill: 1  Need for influenza vaccination -     Flu vaccine trivalent PF, 6mos and older(Flulaval,Afluria,Fluarix,Fluzone)  Hyperlipidemia associated with type 2 diabetes mellitus (HCC) Assessment & Plan: Patient taking pravastatin.  At goal currently . Continue pravastatin      Return in about 3 months (around 12/05/2023) for DM.    Sandre Kitty, MD

## 2023-09-04 NOTE — Assessment & Plan Note (Signed)
Patient taking pravastatin.  At goal currently . Continue pravastatin

## 2023-09-09 DIAGNOSIS — M25572 Pain in left ankle and joints of left foot: Secondary | ICD-10-CM | POA: Diagnosis not present

## 2023-11-16 ENCOUNTER — Other Ambulatory Visit: Payer: Self-pay | Admitting: Family Medicine

## 2023-11-16 ENCOUNTER — Other Ambulatory Visit: Payer: Self-pay | Admitting: Internal Medicine

## 2023-11-16 DIAGNOSIS — E1169 Type 2 diabetes mellitus with other specified complication: Secondary | ICD-10-CM

## 2023-11-16 DIAGNOSIS — E119 Type 2 diabetes mellitus without complications: Secondary | ICD-10-CM

## 2023-12-05 ENCOUNTER — Ambulatory Visit: Payer: BC Managed Care – PPO | Admitting: Family Medicine

## 2023-12-05 ENCOUNTER — Encounter: Payer: Self-pay | Admitting: Family Medicine

## 2023-12-05 VITALS — BP 130/75 | HR 82 | Ht 66.0 in | Wt 181.8 lb

## 2023-12-05 DIAGNOSIS — E1169 Type 2 diabetes mellitus with other specified complication: Secondary | ICD-10-CM

## 2023-12-05 DIAGNOSIS — G629 Polyneuropathy, unspecified: Secondary | ICD-10-CM | POA: Insufficient documentation

## 2023-12-05 DIAGNOSIS — E785 Hyperlipidemia, unspecified: Secondary | ICD-10-CM

## 2023-12-05 DIAGNOSIS — E119 Type 2 diabetes mellitus without complications: Secondary | ICD-10-CM

## 2023-12-05 LAB — POCT GLYCOSYLATED HEMOGLOBIN (HGB A1C): HbA1c POC (<> result, manual entry): 6.9 % (ref 4.0–5.6)

## 2023-12-05 MED ORDER — EMPAGLIFLOZIN 25 MG PO TABS: 25.0000 mg | ORAL_TABLET | Freq: Every day | ORAL | 1 refills | Status: DC

## 2023-12-05 NOTE — Assessment & Plan Note (Signed)
 Patient's A1c went from 7.9-6.9 in 3 months by switching metformin  to Jardiance .  The switch was unintentional and I have meant for him to manage the Jardiance  to his metformin  but nonetheless he has had an improvement after stopping metformin .  Will increase Jardiance  to 25 mg.

## 2023-12-05 NOTE — Progress Notes (Signed)
 Established Patient Office Visit  Subjective   Patient ID: Anthony Benson, male    DOB: 01-18-1976  Age: 48 y.o. MRN: [id number]  Chief Complaint  Patient presents with   Medical Management of Chronic Issues    HPI  DM2-patient states that since the last saw him he has not been taking metformin .  Is taking Jardiance .  He was under the impression I wanted him to switch.  His A1c is actually better at 6.9 today.  Advised him we can continue holding off on metformin  but I would like to increase his Jardiance .  He is okay with this.  Patient has noticed some episodes of the neuropathy in his heels.  Usually after sitting for a prolonged period of time such as in the car.  Goes away after he has scratched his leg out or reposition.  Has a history of a recently fractured ankle, but symptoms have been bilateral.  These episodes have happened 3-4 times.   The 10-year ASCVD risk score (Arnett DK, et al., 2019) is: 4.7%  Health Maintenance Due  Topic Date Due   Pneumococcal Vaccine 71-58 Years old (1 of 2 - PCV) Never done   OPHTHALMOLOGY EXAM  Never done   HIV Screening  Never done   Hepatitis C Screening  Never done   Colonoscopy  Never done   COVID-19 Vaccine (3 - 2024-25 season) 06/29/2023      Objective:     BP 130/75   Pulse 82   Ht 5' 6 (1.676 m)   Wt 181 lb 12.8 oz (82.5 kg)   SpO2 99%   BMI 29.34 kg/m    Physical Exam General: Alert, oriented Pulmonary: Lungs are bilaterally Extremities: Foot exam normal.  Normal sensation.  Results for orders placed or performed in visit on 12/05/23  POCT HgB A1C  Result Value Ref Range   Hemoglobin A1C     HbA1c POC (<> result, manual entry) 6.9 4.0 - 5.6 %   HbA1c, POC (prediabetic range)     HbA1c, POC (controlled diabetic range)          Assessment & Plan:   Type 2 diabetes mellitus without complication, without long-term current use of insulin  (HCC) Assessment & Plan: Patient's A1c went from 7.9-6.9 in 3 months  by switching metformin  to Jardiance .  The switch was unintentional and I have meant for him to manage the Jardiance  to his metformin  but nonetheless he has had an improvement after stopping metformin .  Will increase Jardiance  to 25 mg.  Orders: -     POCT glycosylated hemoglobin (Hb A1C) -     Empagliflozin ; Take 1 tablet (25 mg total) by mouth daily before breakfast.  Dispense: 90 tablet; Refill: 1 -     B12 and Folate Panel  Neuropathy Assessment & Plan: Patient complaining of episodes of burning and tingling in the heels.  Usually associated with certain positions or sitting for prolonged period time and then resolve after he repositions.  Advised him I think it is less likely this is related to his diabetes.  Not on metformin  anymore but will check his B12 and folate.   Hyperlipidemia associated with type 2 diabetes mellitus (HCC) Assessment & Plan: Taking pravastatin .  Recently had his LDL checked at a event sponsored by a local [facility].  States it was 73.  Will recheck cholesterol panel in 3 months.      Return in about 3 months (around 03/03/2024) for DM, hld.    Toribio [name],  MD

## 2023-12-05 NOTE — Patient Instructions (Signed)
 It was nice to see you today,  We addressed the following topics today: -Your A1c was 6.9.  This is good, but I would like to see if we can get it a little bit lower.  I am increasing the Jardiance  from 10 to 25 mg.  I sent in a new prescription - You can continue to hold off on the metformin . - I have added a B12 and folate test to check for causes of neuropathy other than diabetes. - I will follow-up with you in 3 months.  Have a great day,  Rolan Slain, MD

## 2023-12-05 NOTE — Assessment & Plan Note (Signed)
 Patient complaining of episodes of burning and tingling in the heels.  Usually associated with certain positions or sitting for prolonged period time and then resolve after he repositions.  Advised him I think it is less likely this is related to his diabetes.  Not on metformin  anymore but will check his B12 and folate.

## 2023-12-05 NOTE — Assessment & Plan Note (Signed)
 Taking pravastatin .  Recently had his LDL checked at a event sponsored by a local university.  States it was "57".  Will recheck cholesterol panel in 3 months.

## 2023-12-06 LAB — B12 AND FOLATE PANEL
Folate: 17.1 ng/mL (ref >3.0)
Vitamin B-12: 509 pg/mL (ref 232–1245)

## 2023-12-08 ENCOUNTER — Encounter: Payer: Self-pay | Admitting: Family Medicine

## 2024-02-07 ENCOUNTER — Other Ambulatory Visit: Payer: Self-pay | Admitting: Family Medicine

## 2024-02-07 DIAGNOSIS — E1169 Type 2 diabetes mellitus with other specified complication: Secondary | ICD-10-CM

## 2024-03-05 ENCOUNTER — Ambulatory Visit: Payer: BC Managed Care – PPO | Admitting: Family Medicine

## 2024-03-05 ENCOUNTER — Encounter: Payer: Self-pay | Admitting: Family Medicine

## 2024-03-05 VITALS — BP 120/81 | HR 82 | Ht 66.0 in | Wt 176.8 lb

## 2024-03-05 DIAGNOSIS — G629 Polyneuropathy, unspecified: Secondary | ICD-10-CM

## 2024-03-05 DIAGNOSIS — E1169 Type 2 diabetes mellitus with other specified complication: Secondary | ICD-10-CM | POA: Diagnosis not present

## 2024-03-05 DIAGNOSIS — E785 Hyperlipidemia, unspecified: Secondary | ICD-10-CM | POA: Diagnosis not present

## 2024-03-05 DIAGNOSIS — E119 Type 2 diabetes mellitus without complications: Secondary | ICD-10-CM

## 2024-03-05 LAB — POCT GLYCOSYLATED HEMOGLOBIN (HGB A1C): Hemoglobin A1C: 6.5 % — AB (ref 4.0–5.6)

## 2024-03-05 MED ORDER — EMPAGLIFLOZIN 25 MG PO TABS: 25.0000 mg | ORAL_TABLET | Freq: Every day | ORAL | 3 refills | Status: DC

## 2024-03-05 MED ORDER — LANCETS MISC. MISC: 1.0000 | Freq: Every morning | 1 refills | Status: AC

## 2024-03-05 MED ORDER — BLOOD GLUCOSE TEST VI STRP: 1.0000 | ORAL_STRIP | Freq: Every morning | 1 refills | Status: DC

## 2024-03-05 MED ORDER — BLOOD GLUCOSE MONITORING SUPPL DEVI
1.0000 | Freq: Every morning | 0 refills | Status: DC
Start: 2024-03-05 — End: 2024-06-26

## 2024-03-05 MED ORDER — LANCET DEVICE MISC: 1.0000 | Freq: Every morning | 0 refills | Status: AC

## 2024-03-05 NOTE — Assessment & Plan Note (Signed)
 Well controlled on jardiance  25mg .  Will try for further improvement with better diet control.  Sending in glucometer prescription. Getting uacr at next visit.  [place] [facility] [place]

## 2024-03-05 NOTE — Assessment & Plan Note (Signed)
 Heels are better, now with complaint of similar paresthesia in the left great toe, usually positional with lying down or propping feet up, which would suggest a localized nerve impingement.  Foot exam was normal.  No evidence for neuroma.  Offered referral to podiatry if he desires. Pt will hold off for now.

## 2024-03-05 NOTE — Patient Instructions (Signed)
 It was nice to see you today,  We addressed the following topics today: -Your A1c was 6.5 today which is good.  I would not add any new medications I would try to improve it further with diet.  I would recommend using a calorie/carbohydrate counting app to get a better idea of how the foods are affecting you.  You can also get continuous glucose monitors now without a prescription but they can be expensive.  This may help with identifying foods that raise your blood sugar. - If your foot begins to bother you enough to where you would like further workup I would send you to a podiatrist.  Just let us  know.   Have a great day,  Etha Henle, MD

## 2024-03-05 NOTE — Progress Notes (Signed)
 Established Patient Office Visit  Subjective   Patient ID: Anthony Benson, male    DOB: 01-Feb-1976  Age: 48 y.o. MRN: [id number]  Chief Complaint  Patient presents with   Hyperlipidemia   Diabetes    HPI  Dm2 - no issues with increased dose of jardiance . Discussed his A1c, which was 6.5 today.  Discussed trying to improve it with dietary and lifestyle changes prior to adding more medication.  Pt is agreeable to this.    Pt has some occasional parethesia in the left great toe.  Usually occurs if lying in bed or propping his feet up on  a table.  Heel pain has resolved.    [place] [facility] [place] eye exam with diabetic retinopathy screen.     The 10-year ASCVD risk score (Arnett DK, et al., 2019) is: 4.6%  Health Maintenance Due  Topic Date Due   OPHTHALMOLOGY EXAM  Never done   HIV Screening  Never done   Hepatitis C Screening  Never done   Pneumococcal Vaccine 36-29 Years old (1 of 2 - PCV) Never done   Colonoscopy  Never done   COVID-19 Vaccine (3 - 2024-25 season) 06/29/2023   Zoster Vaccines- Shingrix (1 of 2) Never done      Objective:     BP 120/81   Pulse 82   Ht 5\' 6"  (1.676 m)   Wt 176 lb 12.8 oz (80.2 kg)   SpO2 99%   BMI 28.54 kg/m    Physical Exam General: alert, oriented Pulm: no resp distress Ext: normal foot exam. Normal dp pulse b/l .   Results for orders placed or performed in visit on 03/05/24  POCT HgB A1C  Result Value Ref Range   Hemoglobin A1C 6.5 (A) 4.0 - 5.6 %   HbA1c POC (<> result, manual entry)     HbA1c, POC (prediabetic range)     HbA1c, POC (controlled diabetic range)          Assessment & Plan:   Type 2 diabetes mellitus without complication, without long-term current use of insulin  (HCC) Assessment & Plan: Well controlled on jardiance  25mg .  Will try for further improvement with better diet control.  Sending in glucometer prescription. Getting uacr at next visit.  [place] exams [place]  [facility] [place]   Orders: -     Empagliflozin ; Take 1 tablet (25 mg total) by mouth daily before breakfast.  Dispense: 90 tablet; Refill: 3 -     Comprehensive metabolic panel with GFR -     POCT glycosylated hemoglobin (Hb A1C)  Hyperlipidemia associated with type 2 diabetes mellitus (HCC) -     Lipid panel  Neuropathy Assessment & Plan: Heels are better, now with complaint of similar paresthesia in the left great toe, usually positional with lying down or propping feet up, which would suggest a localized nerve impingement.  Foot exam was normal.  No evidence for neuroma.  Offered referral to podiatry if he desires. Pt will hold off for now.    Other orders -     Blood Glucose Monitoring Suppl; 1 each by Does not apply route in the morning. May substitute to any manufacturer covered by patient's insurance.  Dispense: 1 each; Refill: 0 -     Blood Glucose Test; 1 each by In Vitro route in the morning. May substitute to any manufacturer covered by patient's insurance.  Dispense: 100 each; Refill: 1 -     Lancet Device;  1 each by Does not apply route in the morning. May substitute to any manufacturer covered by patient's insurance.  Dispense: 1 each; Refill: 0 -     Lancets Misc.; 1 each by Does not apply route in the morning. May substitute to any manufacturer covered by patient's insurance.  Dispense: 100 each; Refill: 1     Return in about 3 months (around 06/05/2024) for DM.    Laneta Pintos, MD

## 2024-03-06 LAB — LIPID PANEL
Chol/HDL Ratio: 3.2 ratio (ref 0.0–5.0)
Cholesterol, Total: 136 mg/dL (ref 100–199)
HDL: 43 mg/dL (ref >39)
LDL Chol Calc (NIH): 80 mg/dL (ref 0–99)
Triglycerides: 65 mg/dL (ref 0–149)
VLDL Cholesterol Cal: 13 mg/dL (ref 5–40)

## 2024-03-06 LAB — COMPREHENSIVE METABOLIC PANEL WITH GFR
ALT: 31 IU/L (ref 0–44)
AST: 27 IU/L (ref 0–40)
Albumin: 4.7 g/dL (ref 4.1–5.1)
Alkaline Phosphatase: 54 IU/L (ref 44–121)
BUN/Creatinine Ratio: 21 — ABNORMAL HIGH (ref 9–20)
BUN: 19 mg/dL (ref 6–24)
Bilirubin Total: 0.4 mg/dL (ref 0.0–1.2)
CO2: 19 mmol/L — ABNORMAL LOW (ref 20–29)
Calcium: 9.3 mg/dL (ref 8.7–10.2)
Chloride: 103 mmol/L (ref 96–106)
Creatinine, Ser: 0.89 mg/dL (ref 0.76–1.27)
Globulin, Total: 2.7 g/dL (ref 1.5–4.5)
Glucose: 108 mg/dL — ABNORMAL HIGH (ref 70–99)
Potassium: 4.6 mmol/L (ref 3.5–5.2)
Sodium: 139 mmol/L (ref 134–144)
Total Protein: 7.4 g/dL (ref 6.0–8.5)
eGFR: 104 mL/min/{1.73_m2} (ref >59)

## 2024-03-07 ENCOUNTER — Encounter: Payer: Self-pay | Admitting: Family Medicine

## 2024-05-16 ENCOUNTER — Other Ambulatory Visit: Payer: Self-pay | Admitting: Family Medicine

## 2024-05-16 DIAGNOSIS — E1169 Type 2 diabetes mellitus with other specified complication: Secondary | ICD-10-CM

## 2024-05-24 ENCOUNTER — Encounter: Payer: Self-pay | Admitting: Internal Medicine

## 2024-05-28 ENCOUNTER — Other Ambulatory Visit: Payer: Self-pay

## 2024-05-28 MED ORDER — OMEPRAZOLE 40 MG PO CPDR: 40.0000 mg | DELAYED_RELEASE_CAPSULE | Freq: Every day | ORAL | 1 refills | Status: DC

## 2024-06-24 ENCOUNTER — Emergency Department (HOSPITAL_COMMUNITY)

## 2024-06-24 ENCOUNTER — Other Ambulatory Visit: Payer: Self-pay

## 2024-06-24 ENCOUNTER — Emergency Department (HOSPITAL_COMMUNITY)
Admission: EM | Admit: 2024-06-24 | Discharge: 2024-06-24 | Disposition: A | Discharge status: Home / Self Care | Attending: Emergency Medicine | Admitting: Emergency Medicine

## 2024-06-24 DIAGNOSIS — Z7982 Long term (current) use of aspirin: Secondary | ICD-10-CM | POA: Insufficient documentation

## 2024-06-24 DIAGNOSIS — E119 Type 2 diabetes mellitus without complications: Secondary | ICD-10-CM | POA: Insufficient documentation

## 2024-06-24 DIAGNOSIS — R079 Chest pain, unspecified: Secondary | ICD-10-CM | POA: Diagnosis not present

## 2024-06-24 DIAGNOSIS — M25512 Pain in left shoulder: Secondary | ICD-10-CM | POA: Diagnosis not present

## 2024-06-24 DIAGNOSIS — R0789 Other chest pain: Secondary | ICD-10-CM | POA: Diagnosis not present

## 2024-06-24 DIAGNOSIS — R918 Other nonspecific abnormal finding of lung field: Secondary | ICD-10-CM | POA: Diagnosis not present

## 2024-06-24 LAB — CBC
HCT: 47.3 % (ref 39.0–52.0)
Hemoglobin: 15.3 g/dL (ref 13.0–17.0)
MCH: 26 pg (ref 26.0–34.0)
MCHC: 32.3 g/dL (ref 30.0–36.0)
MCV: 80.4 fL (ref 80.0–100.0)
Platelets: 252 K/uL (ref 150–400)
RBC: 5.88 MIL/uL — ABNORMAL HIGH (ref 4.22–5.81)
RDW: 13.8 % (ref 11.5–15.5)
WBC: 6.1 K/uL (ref 4.0–10.5)
nRBC: 0 % (ref 0.0–0.2)

## 2024-06-24 LAB — BASIC METABOLIC PANEL WITH GFR
Anion gap: 10 (ref 5–15)
BUN: 19 mg/dL (ref 6–20)
CO2: 22 mmol/L (ref 22–32)
Calcium: 8.8 mg/dL — ABNORMAL LOW (ref 8.9–10.3)
Chloride: 105 mmol/L (ref 98–111)
Creatinine, Ser: 0.89 mg/dL (ref 0.61–1.24)
GFR, Estimated: >60 mL/min (ref >60)
Glucose, Bld: 135 mg/dL — ABNORMAL HIGH (ref 70–99)
Potassium: 3.9 mmol/L (ref 3.5–5.1)
Sodium: 137 mmol/L (ref 135–145)

## 2024-06-24 LAB — TROPONIN I (HIGH SENSITIVITY)
Troponin I (High Sensitivity): <2 ng/L (ref <18)
Troponin I (High Sensitivity): <2 ng/L (ref <18)

## 2024-06-24 NOTE — Discharge Instructions (Addendum)
 Your workup this evening was reassuring.  As discussed, I have placed a referral to cardiology.  They should contact you to schedule a follow-up appointment.  If you have worsening chest pain, shortness of breath, or develop other life-threatening symptoms, please return immediately to the emergency department or dial 911.

## 2024-06-24 NOTE — ED Provider Notes (Signed)
 [place] [facility] AT [facility] Provider Note   CSN: [id number] Arrival date & time: 06/24/24  9780     Patient presents with: Chest Pain   Anthony Benson is a 48 y.o. male.  Patient with past medical history significant for type II DM, Barrett's esophagus, GERD, labral tear of shoulder status post arthroscopy in 2013 presents to the emergency department from home via EMS complaining of intermittent left-sided chest pain which is been ongoing for approximately 3 weeks.  This evening he woke up at 1 AM with the chest pain but also had some left-sided shoulder pain and felt clammy.  EMS reports grossly unremarkable vitals and twelve-lead.  They did administer the patient 324 mg of aspirin and 1 dose of sublingual nitroglycerin.  The patient reported slight relief after the nitroglycerin was administered.  He currently reports some mild pain in the left side of his chest rated at 2-3 out of 10 in severity.  He denies shortness of breath, nausea, vomiting.  He does endorse the left shoulder pain.  He states that the initial chest pain began shortly after doing yard work.  Movement of his left arm does reproduce a tugging sensation.    Chest Pain      Prior to Admission medications   Medication Sig Start Date End Date Taking? Authorizing Provider  Blood Glucose Monitoring Suppl DEVI 1 each by Does not apply route in the morning. May substitute to any manufacturer covered by patient's insurance. 03/05/24   Chandra Toribio POUR, MD  empagliflozin  (JARDIANCE ) 25 MG TABS tablet Take 1 tablet (25 mg total) by mouth daily before breakfast. 03/05/24   Chandra Toribio POUR, MD  Glucose Blood (BLOOD GLUCOSE TEST STRIPS) STRP 1 each by In Vitro route in the morning. May substitute to any manufacturer covered by patient's insurance. 03/05/24 09/21/24  Chandra Toribio POUR, MD  Ibuprofen 200 MG CAPS Advil    [provider]  Multiple Vitamin (MULTIVITAMIN) tablet Take 1 tablet by mouth  daily.    [provider]  omeprazole  (PRILOSEC) 40 MG capsule Take 1 capsule (40 mg total) by mouth daily. 05/28/24   Kennedy-Smith, Colleen M, NP  pravastatin  (PRAVACHOL ) 10 MG tablet TAKE 1 TABLET DAILY 05/17/24   Chandra Toribio POUR, MD    Allergies: Other, Penicillins, and Cefaclor    Review of Systems  Cardiovascular:  Positive for chest pain.    Updated Vital Signs BP 117/88   Pulse 87   Temp 97.7 F (36.5 C)   Resp 16   Ht 5' 7 (1.702 m)   Wt 81.6 kg   SpO2 99%   BMI 28.19 kg/m   Physical Exam Vitals and nursing note reviewed.  Constitutional:      General: He is not in acute distress.    Appearance: He is well-developed.  HENT:     Head: Normocephalic and atraumatic.  Eyes:     Conjunctiva/sclera: Conjunctivae normal.  Cardiovascular:     Rate and Rhythm: Normal rate and regular rhythm.     Heart sounds: No murmur heard. Pulmonary:     Effort: Pulmonary effort is normal. No respiratory distress.     Breath sounds: Normal breath sounds.  Abdominal:     Palpations: Abdomen is soft.     Tenderness: There is no abdominal tenderness.  Musculoskeletal:        General: No swelling.     Cervical back: Neck supple.     Right lower leg: No edema.  Left lower leg: No edema.  Skin:    General: Skin is warm and dry.  Neurological:     Mental Status: He is alert.  Psychiatric:        Mood and Affect: Mood normal.     (all labs ordered are listed, but only abnormal results are displayed) Labs Reviewed  BASIC METABOLIC PANEL WITH GFR - Abnormal; Notable for the following components:      Result Value   Glucose, Bld 135 (*)    Calcium 8.8 (*)    All other components within normal limits  CBC - Abnormal; Notable for the following components:   RBC 5.88 (*)    All other components within normal limits  TROPONIN I (HIGH SENSITIVITY)  TROPONIN I (HIGH SENSITIVITY)    EKG: EKG Interpretation Date/Time:  Thursday June 24 2024 02:21:07 EDT Ventricular  Rate:  91 PR Interval:  140 QRS Duration:  94 QT Interval:  343 QTC Calculation: 422 R Axis:   29  Text Interpretation: Sinus arrhythmia Premature ventricular complexes No old tracing to compare Confirmed by Raford Lenis ([id number]) on 06/24/2024 2:47:34 AM  Radiology: DG Chest Portable 1 View Result Date: 06/24/2024 CLINICAL DATA:  Intermittent chest pain. EXAM: PORTABLE CHEST 1 VIEW COMPARISON:  December 20, 2015 FINDINGS: The heart size and mediastinal contours are within normal limits. Very mild atelectasis and/or infiltrate is seen within the left lung base. No pleural effusion or pneumothorax is identified. The visualized skeletal structures are unremarkable. IMPRESSION: Very mild left basilar atelectasis and/or infiltrate. Electronically Signed   By: Suzen Dials M.D.   On: 06/24/2024 03:17     Procedures   Medications Ordered in the ED - No data to display                                  Medical Decision Making Amount and/or Complexity of Data Reviewed Labs: ordered. Radiology: ordered.   This patient presents to the ED for concern of chest pain, this involves an extensive number of treatment options, and is a complaint that carries with it a high risk of complications and morbidity.  The differential diagnosis includes ACS, pneumonia, musculoskeletal pain, anxiety, PE, dissection, GERD, others   Co morbidities / Chronic conditions that complicate the patient evaluation  GERD, type II DM   Additional history obtained:  Additional history obtained from EMR   Lab Tests:  I Ordered, and personally interpreted labs.  The pertinent results include: Negative troponins x 2   Imaging Studies ordered:  I ordered imaging studies including chest x-ray I independently visualized and interpreted imaging which showed no acute findings I agree with the radiologist interpretation   Cardiac Monitoring: / EKG:  The patient was maintained on a cardiac monitor.  I  personally viewed and interpreted the cardiac monitored which showed an underlying rhythm of: Sinus rhythm   Test / Admission - Considered:  Patient with nonischemic EKG and negative troponins x 2.  Does not appear consistent with ACS at this time.  No shortness of breath or tachycardia to suggest pulmonary embolism.  Patient feels much better at this time.  I see no indication for further emergent workup but will provide an ambulatory referral to cardiology for further evaluation.  Patient does have a paternal grandfather who had his first heart attack in his 6s and a father who also had a heart attack but in his 76s.  Return precautions have  been provided.  Patient stable for discharge home.      Final diagnoses:  Chest pain, unspecified type    ED Discharge Orders          Ordered    Ambulatory referral to Cardiology       Comments: If you have not heard from the Cardiology office within the next 72 hours please call [phone].   06/24/24 0544               Logan Ubaldo NOVAK, PA-C 06/24/24 0545    Raford Lenis, MD 06/24/24 [phone]

## 2024-06-24 NOTE — ED Triage Notes (Signed)
 Pt BIB EMS from home. C/o intermittent chest and shoulder pain over the last three weeks, CP began tonight around 1am that felt different and felt clammy. Pt reports slight relief following nitroglycerin.   EMS VS 118/80, HR 90, unremarkable EKG, RR 16, 98% RA, cbg 124 EMS admin 324 aspirin and 1 nitroglycerin SL

## 2024-06-25 ENCOUNTER — Encounter: Payer: Self-pay | Admitting: Family Medicine

## 2024-06-25 ENCOUNTER — Ambulatory Visit: Admitting: Family Medicine

## 2024-06-25 VITALS — BP 119/82 | HR 89 | Ht 67.0 in | Wt 178.8 lb

## 2024-06-25 DIAGNOSIS — R079 Chest pain, unspecified: Secondary | ICD-10-CM | POA: Diagnosis not present

## 2024-06-25 DIAGNOSIS — E1169 Type 2 diabetes mellitus with other specified complication: Secondary | ICD-10-CM

## 2024-06-25 DIAGNOSIS — E785 Hyperlipidemia, unspecified: Secondary | ICD-10-CM | POA: Diagnosis not present

## 2024-06-25 DIAGNOSIS — E119 Type 2 diabetes mellitus without complications: Secondary | ICD-10-CM | POA: Diagnosis not present

## 2024-06-25 LAB — POCT GLYCOSYLATED HEMOGLOBIN (HGB A1C): HbA1c POC (<> result, manual entry): 6.8 % (ref 4.0–5.6)

## 2024-06-25 MED ORDER — FREESTYLE LIBRE 3 SENSOR MISC: 3 refills | Status: DC

## 2024-06-25 NOTE — Assessment & Plan Note (Addendum)
-   Atypical chest pain with recent ED visit for evaluation. Presentation included left shoulder blade pain and pressure, and clamminess. ED workup was negative. Today, pain is located more anteriorly across the top of the breast. No pain with palpation or deep breath. Increased fatigue and slightly elevated resting heart rate noted recently. Strong family history of premature CAD. Physical exam is non-tender. Pt states it does not feel similar to his GERD symptoms. Possibility  for cardiac etiology remains despite negative ED workup. Referred to cardiology from the ED. - Await cardiology consultation. - Labs ordered today: ApoB, Lp(a), TSH. - Continue pravastatin . - Discussed maximizing cardiovascular risk reduction.

## 2024-06-25 NOTE — Patient Instructions (Addendum)
 It was nice to see you today,  We addressed the following topics today: -I am sending in a order for a continuous glucose monitor, freestyle libre 3+.  I have given you a sheet with a co-pay card that you can present to them. - I am ordering some lab test to further evaluate for your cardiovascular risk. -  Have a great day,  Rolan Slain, MD

## 2024-06-25 NOTE — Progress Notes (Signed)
 Established Patient Office Visit  Subjective   Patient ID: Anthony Benson, male    DOB: [birth year]  Age: 48 y.o. MRN: [id number]  Chief Complaint  Patient presents with   Medical Management of Chronic Issues    HPI  Subjective - Presented to ED yesterday (06/24/2024) around 1:00 AM for chest pain. Reports waking with persistent chest discomfort, described as pain and pressure in the left shoulder blade area, accompanied by clamminess. Had initially thought it was a pulled muscle from a few weeks prior. Workup at ED was reportedly unremarkable. - Today, reports the shoulder blade pain is resolved but still has discomfort in the chest and across the top of the breast. - Describes increased fatigue and taking longer to get going for the past few weeks, both in the morning and throughout the day on his worst days. Attributes this to recent life stressors (graduation, wedding, college). Work-related stress has improved. - Reports monitoring blood pressure at home since early August, which has been good. Notes pulse has been higher than usual, around 90 bpm, compared to his baseline in the low-to-mid 80s. - Reports resolution of heel and toe neuropathy/tingling.  Medications Medications include Prilosec daily, pravastatin , and a multivitamin.  PMH, PSH, FH, Social Hx PMHx: GERD, hyperlipidemia, prediabetes. FHx: Paternal grandfather had an MI in his 56s. Father had first MI at age 62. Maternal grandfather died of an MI at age 79. No family history of stroke. Social Hx: Reports recent life stressors including children's graduation, wedding, and starting college. Work stress has resolved.  ROS Pertinent positives: chest pain/pressure, clamminess, fatigue. Pertinent negatives: No nausea or vomiting. No shortness of breath. No radiation of pain down the arm. Pain does not worsen with deep inspiration. No pain on palpation of the chest.   The 10-year ASCVD risk score (Arnett DK, et al.,  2019) is: 4%  Health Maintenance Due  Topic Date Due   OPHTHALMOLOGY EXAM  Never done   HIV Screening  Never done   Hepatitis C Screening  Never done   Pneumococcal Vaccine: 50+ Years (1 of 2 - PCV) Never done   Hepatitis B Vaccines 19-59 Average Risk (1 of 3 - 19+ 3-dose series) Never done   Colonoscopy  Never done   COVID-19 Vaccine (3 - 2024-25 season) 06/29/2023   Zoster Vaccines- Shingrix (1 of 2) Never done   Diabetic kidney evaluation - Urine ACR  06/03/2024   INFLUENZA VACCINE  05/28/2024      Objective:     BP 119/82   Pulse 89   Ht 5' 7 (1.702 m)   Wt 178 lb 12.8 oz (81.1 kg)   SpO2 100%   BMI 28.00 kg/m    Physical Exam General: No acute distress. CV: Regular rate and rhythm, no murmurs, rubs, or gallops. PULM: Lungs clear to auscultation bilaterally. No wheezes, rales, or rhonchi. MSK: No tenderness to palpation over the chest wall, sternum, or costochondral junctions. Full range of motion of the shoulder. No pain with forced shoulder extension or flexion   Results for orders placed or performed in visit on 06/25/24  POCT glycosylated hemoglobin (Hb A1C)  Result Value Ref Range   Hemoglobin A1C     HbA1c POC (<> result, manual entry) 6.8 4.0 - 5.6 %   HbA1c, POC (prediabetic range)     HbA1c, POC (controlled diabetic range)          Assessment & Plan:   Type 2 diabetes mellitus without complication, without long-term current  use of insulin  (HCC) Assessment & Plan: - Last A1c was 6.8, slightly elevated from prior but still in controlled range. Acknowledges diet has been less consistent due to recent life events. Has been unable to obtain glucose monitor or test strips from [facility] due to a prior authorization/information request issue since mid-July. - Resent prescription to [facility] for glucose monitor and test strips  - Patient prefers to try a CGM. Sent prescription for [employer] system. - Counseled that insurance may not cover CGM without documented  hypoglycemia or insulin  dependence. Provided copay card information. - Labs ordered: urine microalbumin/creatinine ratio. - Has had recent annual eye exam, will request report. - Follow up in 3 months.  Orders: -     POCT glycosylated hemoglobin (Hb A1C) -     Microalbumin / creatinine urine ratio  Hyperlipidemia associated with type 2 diabetes mellitus (HCC) -     Lipid panel -     Apolipoprotein B -     Lipoprotein A (LPA)  Chest pain, unspecified type Assessment & Plan: - Atypical chest pain with recent ED visit for evaluation. Presentation included left shoulder blade pain and pressure, and clamminess. ED workup was negative. Today, pain is located more anteriorly across the top of the breast. No pain with palpation or deep breath. Increased fatigue and slightly elevated resting heart rate noted recently. Strong family history of premature CAD. Physical exam is non-tender. Pt states it does not feel similar to his GERD symptoms. Possibility  for cardiac etiology remains despite negative ED workup. Referred to cardiology from the ED. - Await cardiology consultation. - Labs ordered today: ApoB, Lp(a), TSH. - Continue pravastatin . - Discussed maximizing cardiovascular risk reduction.  Orders: -     Apolipoprotein B -     Lipoprotein A (LPA)  Other orders -     FreeStyle Libre 3 Sensor; Apply to back of arm every 14 days  Dispense: 2 each; Refill: 3     Return in about 3 months (around 09/25/2024) for DM.    Toribio [name], MD

## 2024-06-25 NOTE — Assessment & Plan Note (Addendum)
-   Last A1c was 6.8, slightly elevated from prior but still in controlled range. Acknowledges diet has been less consistent due to recent life events. Has been unable to obtain glucose monitor or test strips from [facility] due to a prior authorization/information request issue since mid-July. - [facility] to [facility] for glucose monitor and test strips  - Patient prefers to try a CGM. Sent prescription for [employer] system. - Counseled that insurance may not cover CGM without documented hypoglycemia or insulin  dependence. Provided copay card information. - Labs ordered: urine microalbumin/creatinine ratio. - Has had recent annual eye exam, will request report. - Follow up in 3 months.

## 2024-06-26 LAB — MICROALBUMIN / CREATININE URINE RATIO
Creatinine, Urine: 88.2 mg/dL
Microalb/Creat Ratio: 4 mg/g{creat} (ref 0–29)
Microalbumin, Urine: 3.2 ug/mL

## 2024-06-28 LAB — LIPOPROTEIN A (LPA): Lipoprotein (a): 10 nmol/L (ref <75.0)

## 2024-06-28 LAB — APOLIPOPROTEIN B: Apolipoprotein B: 110 mg/dL — ABNORMAL HIGH (ref <90)

## 2024-06-28 LAB — LIPID PANEL
Chol/HDL Ratio: 4.8 ratio (ref 0.0–5.0)
Cholesterol, Total: 187 mg/dL (ref 100–199)
HDL: 39 mg/dL — ABNORMAL LOW (ref >39)
LDL Chol Calc (NIH): 133 mg/dL — ABNORMAL HIGH (ref 0–99)
Triglycerides: 79 mg/dL (ref 0–149)
VLDL Cholesterol Cal: 15 mg/dL (ref 5–40)

## 2024-06-29 ENCOUNTER — Other Ambulatory Visit: Payer: Self-pay

## 2024-06-29 ENCOUNTER — Encounter: Payer: Self-pay | Admitting: Family Medicine

## 2024-06-29 ENCOUNTER — Telehealth: Payer: Self-pay | Admitting: Nurse Practitioner

## 2024-06-29 ENCOUNTER — Ambulatory Visit: Payer: Self-pay

## 2024-06-29 ENCOUNTER — Other Ambulatory Visit: Payer: Self-pay | Admitting: Family Medicine

## 2024-06-29 MED ORDER — DEXCOM G7 SENSOR MISC: 1.0000 | 10 refills | Status: DC

## 2024-06-29 MED ORDER — OMEPRAZOLE 40 MG PO CPDR: 40.0000 mg | DELAYED_RELEASE_CAPSULE | Freq: Every day | ORAL | 1 refills | Status: AC

## 2024-06-29 NOTE — Telephone Encounter (Signed)
 Inbound call from pt requesting a refill on his medication Omeprazole . Patient is requesting that we send his refill [facility] to [facility]. Please advise.

## 2024-06-30 ENCOUNTER — Ambulatory Visit: Attending: Nurse Practitioner | Admitting: Nurse Practitioner

## 2024-06-30 ENCOUNTER — Encounter: Payer: Self-pay | Admitting: Nurse Practitioner

## 2024-06-30 VITALS — BP 115/73 | HR 92 | Ht 67.0 in | Wt 178.0 lb

## 2024-06-30 DIAGNOSIS — E119 Type 2 diabetes mellitus without complications: Secondary | ICD-10-CM

## 2024-06-30 DIAGNOSIS — I493 Ventricular premature depolarization: Secondary | ICD-10-CM

## 2024-06-30 DIAGNOSIS — Z8249 Family history of ischemic heart disease and other diseases of the circulatory system: Secondary | ICD-10-CM | POA: Diagnosis not present

## 2024-06-30 DIAGNOSIS — E782 Mixed hyperlipidemia: Secondary | ICD-10-CM | POA: Diagnosis not present

## 2024-06-30 DIAGNOSIS — R072 Precordial pain: Secondary | ICD-10-CM

## 2024-06-30 MED ORDER — METOPROLOL TARTRATE 100 MG PO TABS: ORAL_TABLET | ORAL | 0 refills | Status: DC

## 2024-06-30 NOTE — Patient Instructions (Signed)
 Medication Instructions:  Metoprolol  Tartrate 100 mg take 1 tablet 2 hours prior to cardiac ct.  Continue current medication *If you need a refill on your cardiac medications before your next appointment, please call your pharmacy*  Lab Work: NONE ordered at this time of appointment   Testing/Procedures: Your physician has requested that you have an echocardiogram. Echocardiography is a painless test that uses sound waves to create images of your heart. It provides your doctor with information about the size and shape of your heart and how well your heart's chambers and valves are working. This procedure takes approximately one hour. There are no restrictions for this procedure. Please do NOT wear cologne, perfume, aftershave, or lotions (deodorant is allowed). Please arrive 15 minutes prior to your appointment time.  Please note: We ask at that you not bring children with you during ultrasound (echo/ vascular) testing. Due to room size and safety concerns, children are not allowed in the ultrasound rooms during exams. Our front office staff cannot provide observation of children in our lobby area while testing is being conducted. An adult accompanying a patient to their appointment will only be allowed in the ultrasound room at the discretion of the ultrasound technician under special circumstances. We apologize for any inconvenience.    Your cardiac CT will be scheduled at one of the below locations:   [facility] [address]  [place], [place] [address] [phone]  If scheduled at the Heart and Vascular Tower at [facility], please enter the parking lot using the [address] entrance and use the FREE valet service at the patient drop-off area. Enter the building and check-in with registration on the main floor.  Please follow these instructions carefully (unless otherwise directed):  An IV will be required for this test and Nitroglycerin will  be given.  Hold all erectile dysfunction medications at least 3 days (72 hrs) prior to test. (Ie viagra, cialis, sildenafil, tadalafil, etc)   On the Night Before the Test: Be sure to Drink plenty of water. Do not consume any caffeinated/decaffeinated beverages or chocolate 12 hours prior to your test. Do not take any antihistamines 12 hours prior to your test.   On the Day of the Test: Drink plenty of water until 1 hour prior to the test. Do not eat any food 1 hour prior to test. You may take your regular medications prior to the test.  Take metoprolol  (Lopressor ) two hours prior to test. If you take Furosemide/Hydrochlorothiazide/Spironolactone/Chlorthalidone, please HOLD on the morning of the test. Patients who wear a continuous glucose monitor MUST remove the device prior to scanning.  After the Test: Drink plenty of water. After receiving IV contrast, you may experience a mild flushed feeling. This is normal. On occasion, you may experience a mild rash up to 24 hours after the test. This is not dangerous. If this occurs, you can take Benadryl 25 mg, Zyrtec, Claritin, or Allegra and increase your fluid intake. (Patients taking Tikosyn should avoid Benadryl, and may take Zyrtec, Claritin, or Allegra) If you experience trouble breathing, this can be serious. If it is severe call 911 IMMEDIATELY. If it is mild, please call our office.  We will call to schedule your test 2-4 weeks out understanding that some insurance companies will need an authorization prior to the service being performed.   For more information and frequently asked questions, please visit our website : [email or web address]  For non-scheduling related questions, please contact the cardiac imaging nurse  navigator should you have any questions/concerns: Cardiac Imaging Nurse Navigators Direct Office Dial: [phone]   For scheduling needs, including cancellations and rescheduling, please call [place],  [phone].   Follow-Up: At [facility], you and your health needs are our priority.  As part of our continuing mission to provide you with exceptional heart care, our providers are all part of one team.  This team includes your primary Cardiologist (physician) and Advanced Practice Providers or APPs (Physician Assistants and Nurse Practitioners) who all work together to provide you with the care you need, when you need it.  Your next appointment:   6-8 week(s)  Provider:   Dr. Barbaraann or Damien Braver NP   We recommend signing up for the patient portal called MyChart.  Sign up information is provided on this After Visit Summary.  MyChart is used to connect with patients for Virtual Visits (Telemedicine).  Patients are able to view lab/test results, encounter notes, upcoming appointments, etc.  Non-urgent messages can be sent to your provider as well.   To learn more about what you can do with MyChart, go to [email or web address].

## 2024-06-30 NOTE — Progress Notes (Signed)
 Office Visit    Patient Name: Anthony Benson Date of Encounter: 07/03/2024  Primary Care Provider:  Chandra Toribio POUR, MD Primary Cardiologist:  None  Chief Complaint    48 year old male with a history of atypical chest pain, family history of CAD, hyperlipidemia, type 2 diabetes, Barrett's esophagus, and GERD presents for heart first clinic new patient visit.  Past Medical History    Past Medical History:  Diagnosis Date   Allergy    Barrett's esophagus    Diabetes (HCC) 01/2023   GERD (gastroesophageal reflux disease)    OTC -prn   Labral tear of shoulder left shoulder   PONV (postoperative nausea and vomiting)    mild   Past Surgical History:  Procedure Laterality Date   ADENOIDECTOMY  1980   KNEE ARTHROSCOPY Left    x 3   SHOULDER ARTHROSCOPY  03/09/2012   Procedure: ARTHROSCOPY SHOULDER;  Surgeon: Reyes JAYSON Billing, MD;  Location: [facility];  Service: Orthopedics;  Laterality: Left;  left shoulder scope exam under anes subarcomial decompression and mini open rotator cuff repair   ower    Allergies  Allergies  Allergen Reactions   Other Anaphylaxis    [place] Nuts   Penicillins Anaphylaxis    Has patient had a PCN reaction causing immediate rash, facial/tongue/throat swelling, SOB or lightheadedness with hypotension: Yes  Has patient had a PCN reaction causing severe rash involving mucus membranes or skin necrosis: Yes  Has patient had a PCN reaction that required hospitalization No  Has patient had a PCN reaction occurring within the last 10 years: No  If all of the above answers are NO, then may proceed with Cephalosporin use.   Cefaclor Other (See Comments)     Labs/Other Studies Reviewed    The following studies were reviewed today:     Recent Labs: 03/05/2024: ALT 31 06/24/2024: BUN 19; Creatinine, Ser 0.89; Hemoglobin 15.3; Platelets 252; Potassium 3.9; Sodium 137  Recent Lipid Panel    Component Value Date/Time   CHOL 187  06/25/2024 0841   TRIG 79 06/25/2024 0841   HDL 39 (L) 06/25/2024 0841   CHOLHDL 4.8 06/25/2024 0841   LDLCALC 133 (H) 06/25/2024 0841    History of Present Illness    48 year old male with a history of atypical chest pain, family history of CAD, hyperlipidemia, type 2 diabetes, Barrett's esophagus, and GERD who presents for heart first clinic new patient evaluation.  He was seen in the ED on 06/24/2024 in the setting of lower chest pain.  He reported a several week history of intermittent chest pain, increased fatigue.  Prior to his ED visit he was awakened with pain to his L scapular area, associated diaphoresis. EKG showed sinus rhythm with sinus arrhythmia, PVCs. Troponin was negative x 2. He was referred to cardiology.  He has no personal prior cardiac history.  He has a family history of heart disease, paternal grandfather having a heart attack in at age 23.  His father had a heart attack in his 47s.    He presents today for new patient evaluation.  He was shoveling in his yard and hit a root in the beginning of August.  Since this time he has had intermittent chest discomfort.  He will have twinges of pain which last for seconds and resolve spontaneously.  He has not had any symptoms as severe as those which prompted her his ED visit in late August.  He has been under a significant mount of personal stress.  He is somewhat active, he does yard work, walks daily for approximately 10 to 15 minutes at a time.  He works as a Pharmacist, community for [employer].  Home Medications    Current Outpatient Medications  Medication Sig Dispense Refill   Continuous Glucose Sensor (DEXCOM G7 SENSOR) MISC 1 each by Does not apply route every 14 (fourteen) days. 1 each 10   Continuous Glucose Sensor (FREESTYLE LIBRE 3 SENSOR) MISC APPLY TO BACK OF ARM EVERY 14 DAYS 1 each 3   empagliflozin  (JARDIANCE ) 25 MG TABS tablet Take 1 tablet (25 mg total) by mouth daily before breakfast. 90 tablet 3   Ibuprofen  200 MG CAPS Advil     Multiple Vitamin (MULTIVITAMIN) tablet Take 1 tablet by mouth daily.     omeprazole  (PRILOSEC) 40 MG capsule Take 1 capsule (40 mg total) by mouth daily. 90 capsule 1   pravastatin  (PRAVACHOL ) 10 MG tablet TAKE 1 TABLET DAILY 90 tablet 0   metoprolol  tartrate (LOPRESSOR ) 100 MG tablet Take 1 tablet 2 hours prior to cardiac ct 1 tablet 0   No current facility-administered medications for this visit.     Review of Systems    He denies palpitations, dyspnea, pnd, orthopnea, n, v, dizziness, syncope, edema, weight gain, or early satiety. All other systems reviewed and are otherwise negative except as noted above.   Physical Exam    VS:  BP 115/73   Pulse 92   Ht 5' 7 (1.702 m)   Wt 178 lb (80.7 kg)   SpO2 98%   BMI 27.88 kg/m   GEN: Well nourished, well developed, in no acute distress. HEENT: normal. Neck: Supple, no JVD, carotid bruits, or masses. Cardiac: RRR, no murmurs, rubs, or gallops. No clubbing, cyanosis, edema.  Radials/DP/PT 2+ and equal bilaterally.  Respiratory:  Respirations regular and unlabored, clear to auscultation bilaterally. GI: Soft, nontender, nondistended, BS + x 4. MS: no deformity or atrophy. Skin: warm and dry, no rash. Neuro:  Strength and sensation are intact. Psych: Normal affect.  Accessory Clinical Findings    ECG personally reviewed by me today -    - no EKG in office today.    Lab Results  Component Value Date   WBC 6.1 06/24/2024   HGB 15.3 06/24/2024   HCT 47.3 06/24/2024   MCV 80.4 06/24/2024   PLT 252 06/24/2024   Lab Results  Component Value Date   CREATININE 0.89 06/24/2024   BUN 19 06/24/2024   NA 137 06/24/2024   K 3.9 06/24/2024   CL 105 06/24/2024   CO2 22 06/24/2024   Lab Results  Component Value Date   ALT 31 03/05/2024   AST 27 03/05/2024   ALKPHOS 54 03/05/2024   BILITOT 0.4 03/05/2024   Lab Results  Component Value Date   CHOL 187 06/25/2024   HDL 39 (L) 06/25/2024   LDLCALC 133 (H)  06/25/2024   TRIG 79 06/25/2024   CHOLHDL 4.8 06/25/2024    Lab Results  Component Value Date   HGBA1C 6.8 06/25/2024    Assessment & Plan   1. Precordial pain/family history of CAD/PVCs: Recent ED visit in the setting of chest pain.  Symptoms began after shoveling in his yard and hitting a root.  EKG in ED showed sinus rhythm, sinus arrhythmia, PVCs.  Troponin was negative x 2.  Reports a family history of CAD.  Will check echocardiogram given PVCs on EKG, chest pain.  Symptoms overall atypical, possibly musculoskeletal.  However, through shared  decision making, will pursue coronary CT angiogram to rule out ischemic cause of symptoms.   2. Hyperlipidemia: LDL was 133 in 05/2024.  On pravastatin .  Pending repeat lipids per PCP.  3. Type 2 diabetes: A1C was 6.5 in 02/2024.  Monitored and managed per PCP.   4. Disposition: Follow-up in 6 to 8 weeks.  He wishes to establish with Dr. Barbaraann.  Damien JAYSON Braver, NP 07/03/2024, 2:32 PM

## 2024-07-03 ENCOUNTER — Encounter: Payer: Self-pay | Admitting: Nurse Practitioner

## 2024-07-07 ENCOUNTER — Encounter (HOSPITAL_COMMUNITY): Payer: Self-pay

## 2024-07-09 ENCOUNTER — Ambulatory Visit (HOSPITAL_COMMUNITY)
Admission: RE | Admit: 2024-07-09 | Discharge: 2024-07-09 | Disposition: A | Discharge status: Home / Self Care | Source: Ambulatory Visit | Attending: Nurse Practitioner | Admitting: Nurse Practitioner

## 2024-07-09 ENCOUNTER — Other Ambulatory Visit: Payer: Self-pay | Admitting: Internal Medicine

## 2024-07-09 ENCOUNTER — Ambulatory Visit (HOSPITAL_COMMUNITY)
Admission: RE | Admit: 2024-07-09 | Discharge: 2024-07-09 | Disposition: A | Discharge status: Home / Self Care | Source: Ambulatory Visit | Attending: Internal Medicine

## 2024-07-09 DIAGNOSIS — Z8249 Family history of ischemic heart disease and other diseases of the circulatory system: Secondary | ICD-10-CM | POA: Diagnosis not present

## 2024-07-09 DIAGNOSIS — R072 Precordial pain: Secondary | ICD-10-CM

## 2024-07-09 DIAGNOSIS — I2510 Atherosclerotic heart disease of native coronary artery without angina pectoris: Secondary | ICD-10-CM | POA: Diagnosis not present

## 2024-07-09 DIAGNOSIS — I493 Ventricular premature depolarization: Secondary | ICD-10-CM | POA: Diagnosis not present

## 2024-07-09 DIAGNOSIS — R931 Abnormal findings on diagnostic imaging of heart and coronary circulation: Secondary | ICD-10-CM

## 2024-07-09 MED ORDER — NITROGLYCERIN 0.4 MG SL SUBL
0.8000 mg | SUBLINGUAL_TABLET | Freq: Once | SUBLINGUAL | Status: AC
  Administered 2024-07-09: 0.8 mg via SUBLINGUAL

## 2024-07-09 MED ORDER — IOHEXOL 350 MG/ML SOLN
100.0000 mL | Freq: Once | INTRAVENOUS | Status: AC | PRN
  Administered 2024-07-09: 100 mL via INTRAVENOUS

## 2024-07-13 ENCOUNTER — Ambulatory Visit: Payer: Self-pay | Admitting: Nurse Practitioner

## 2024-07-13 ENCOUNTER — Ambulatory Visit: Payer: Self-pay | Admitting: Cardiovascular Disease

## 2024-07-13 DIAGNOSIS — E782 Mixed hyperlipidemia: Secondary | ICD-10-CM

## 2024-07-13 DIAGNOSIS — Z79899 Other long term (current) drug therapy: Secondary | ICD-10-CM

## 2024-07-13 MED ORDER — ATORVASTATIN CALCIUM 40 MG PO TABS: 40.0000 mg | ORAL_TABLET | Freq: Every day | ORAL | 3 refills | Status: DC

## 2024-07-15 ENCOUNTER — Telehealth: Payer: Self-pay

## 2024-07-15 NOTE — Telephone Encounter (Signed)
 See message from pt below. Please advise.

## 2024-07-15 NOTE — Telephone Encounter (Signed)
 While preparing patient's chart for upcoming pre-visit appt., LPN noticed that patient was to have had echocardiogram on 06/30/24. Results not available.   Note from Dr. Barbaraann dated 07/13/24 states patient has small left circumflex artery and narrowing is of borderline significance. Does this patient need cardiac clearance prior to EGD/colonoscopy on 08/13/24?

## 2024-07-16 NOTE — Telephone Encounter (Signed)
 I can probably get clearance pretty quickly since he sees [facility].  Would you like me to try first and see if I can get it without canceling his procedures and let him still come next month?

## 2024-07-16 NOTE — Telephone Encounter (Signed)
 Anthony Benson, I last saw patient in office 08/12/2023. At that time, patient did not have issues with chest pain or potential heart disease. Patient was seen the ED 06/24/2024 with left chest pain therefore he is undergoing cardiac evaluation. Please contact patient and cancel his EGD/colonoscopy 08/13/2024 and let him we need cardiac clearance prior to proceeding with an EGD and colonoscopy. Pls send his cardiologist a request for an official cardiac clearance. Reschedule EGD/colonoscopy once cardiac clearance received.   Inocente and Anthony Benson, thank you for great work and for bringing this new development to my attention.   Livio Ledwith

## 2024-07-16 NOTE — Telephone Encounter (Signed)
 [facility] Pre-operative Risk Assessment     Request for surgical clearance:     Endoscopy Procedure  What type of surgery is being performed?     Endo/Colon  When is this surgery scheduled?     08/13/2024  What type of clearance is required ?   Pharmacy  Are there any medications that need to be held prior to surgery and how long? General cardiac clearance after recent cardiac workup  Practice name and name of physician performing surgery?      [facility]  What is your office phone and fax number?      Phone- [phone]  Fax- [phone]  Anesthesia type (None, local, MAC, general) ?       MAC   Please route your response to [facility]

## 2024-07-16 NOTE — Telephone Encounter (Signed)
 Spoke with patient to update him on getting cardiac clearance.  He has an echo scheduled for 10/9 and is going to make sure they know we need their response quickly.  There should still be plenty of time to get clearance.  I told patient I would keep him updated.  Patient agreed.

## 2024-08-02 ENCOUNTER — Ambulatory Visit (AMBULATORY_SURGERY_CENTER)

## 2024-08-02 VITALS — Ht 67.0 in | Wt 178.0 lb

## 2024-08-02 DIAGNOSIS — Z1211 Encounter for screening for malignant neoplasm of colon: Secondary | ICD-10-CM

## 2024-08-02 DIAGNOSIS — K2270 Barrett's esophagus without dysplasia: Secondary | ICD-10-CM

## 2024-08-02 MED ORDER — NA SULFATE-K SULFATE-MG SULF 17.5-3.13-1.6 GM/177ML PO SOLN: 1.0000 | Freq: Once | ORAL | 0 refills | Status: AC

## 2024-08-02 NOTE — Progress Notes (Signed)
 Pre visit completed via phone call; Patient verified name, DOB, and address; No egg or soy allergy known to patient;  No issues known to pt with past sedation with any surgeries or procedures; Patient denies ever being told they had issues or difficulty with intubation; No FH of Malignant Hyperthermia; Pt is not on diet pills; Pt is not on home 02;  Pt is not on blood thinners;  Pt denies issues with constipation;  No A fib or A flutter; Have any cardiac testing pending--NO Insurance verified during PV appt--- BCBS Pt can ambulate without assistance;  Pt denies use of chewing tobacco Discussed diabetic/weight loss medication holds; Discussed NSAID holds; Checked BMI to be less than 50; Pt instructed to use Singlecare.com or GoodRx for a price reduction on prep;  Patient's chart reviewed by Norleen Schillings CNRA prior to previsit and patient appropriate for the [facility]; Pre visit completed and red dot placed by patient's name on their procedure day (on provider's schedule); Instructions sent to MyChart per patient request also printed and placed on 2nd floor with receptionist for pick up by patient;

## 2024-08-03 ENCOUNTER — Encounter: Payer: Self-pay | Admitting: Internal Medicine

## 2024-08-05 ENCOUNTER — Ambulatory Visit (HOSPITAL_COMMUNITY)
Admission: RE | Admit: 2024-08-05 | Discharge: 2024-08-05 | Disposition: A | Discharge status: Home / Self Care | Source: Ambulatory Visit | Attending: Cardiology | Admitting: Cardiology

## 2024-08-05 DIAGNOSIS — I493 Ventricular premature depolarization: Secondary | ICD-10-CM | POA: Diagnosis not present

## 2024-08-05 DIAGNOSIS — Z8249 Family history of ischemic heart disease and other diseases of the circulatory system: Secondary | ICD-10-CM | POA: Insufficient documentation

## 2024-08-05 DIAGNOSIS — R072 Precordial pain: Secondary | ICD-10-CM | POA: Insufficient documentation

## 2024-08-05 LAB — ECHOCARDIOGRAM COMPLETE
Area-P 1/2: 4.06 cm2
S' Lateral: 2.9 cm

## 2024-08-06 NOTE — Telephone Encounter (Signed)
   Patient Name: Anthony Benson  DOB: August 31, 1976 MRN: [id number]  Primary Cardiologist: None  Chart reviewed as part of pre-operative protocol coverage. Given past medical history and time since last visit, based on ACC/AHA guidelines, [place] Nurse is at acceptable risk for the planned procedure without further cardiovascular testing.   Echocardiogram on 08/06/2024 was normal.   The patient was advised that if he develops new symptoms prior to surgery to contact our office to arrange for a follow-up visit, and he verbalized understanding.  I will route this recommendation to the requesting party via Epic fax function and remove from pre-op pool.  Please call with questions.  Lamarr Satterfield, NP 08/06/2024, 9:03 AM

## 2024-08-06 NOTE — Telephone Encounter (Signed)
 Anthony Benson,  You saw this patient on 06/30/2024 and ordered echo. This was completed on 08/05/2024. It was normal. Per protocol we request that you comment on his cardiac risk to proceed with EDG/Colonoscopy since it has been less than 2 months since evaluated in the office. Please send your comment to P CV Pre-Op Pool.  Thank you, Anthony Satterfield DNP, ANP, AACC.

## 2024-08-10 ENCOUNTER — Encounter: Payer: Self-pay | Admitting: Internal Medicine

## 2024-08-10 NOTE — Telephone Encounter (Signed)
   Name: Anthony Benson  DOB: [birth year]  MRN: [id number]   Primary Cardiologist: None  Chart reviewed as part of pre-operative protocol coverage. Patient was contacted 08/10/2024 in reference to pre-operative risk assessment for pending surgery as outlined below.  Anthony Benson was last seen on 06/30/2024 by Damien Braver, NP.  Coronary CT angiogram revealed nonobstructive coronary artery disease with soft plaque.  Echocardiogram was normal.  Since that day, Anthony Benson has done well. He he denies any new symptoms or concerns.  He is able to complete greater than 4 METS without difficulty.  Therefore, based on ACC/AHA guidelines, the patient would be at acceptable risk for the planned procedure without further cardiovascular testing.   The patient was advised that if he develops new symptoms prior to surgery to contact our office to arrange for a follow-up visit, and he verbalized understanding.  I will route this recommendation to the requesting party via Epic fax function and remove from pre-op pool. Please call with questions.  Damien JAYSON Braver, NP 08/10/2024, 5:04 PM

## 2024-08-10 NOTE — Telephone Encounter (Signed)
 Patient's procedure is this week.  If someone could check on this I'd appreciate it.  Thanks!

## 2024-08-11 ENCOUNTER — Telehealth: Payer: Self-pay | Admitting: Nurse Practitioner

## 2024-08-11 NOTE — Telephone Encounter (Signed)
 Dr. Abran, refer to phone note 9/18 - 07/07/2024. Patient to proceed with EGD/colonoscopy in [facility] as scheduled 08/13/2024.   Cardiac clearance received per Damien Braver cardiology NP 08/10/2024 as follows: Chart reviewed as part of pre-operative protocol coverage. Patient was contacted 08/10/2024 in reference to pre-operative risk assessment for pending surgery as outlined below.  Anthony Benson was last seen on 06/30/2024 by Damien Braver, NP.  Coronary CT angiogram revealed nonobstructive coronary artery disease with soft plaque.  Echocardiogram was normal.  Since that day, Anthony Benson has done well. He he denies any new symptoms or concerns.  He is able to complete greater than 4 METS without difficulty.   Therefore, based on ACC/AHA guidelines, the patient would be at acceptable risk for the planned procedure without further cardiovascular testing.    The patient was advised that if he develops new symptoms prior to surgery to contact our office to arrange for a follow-up visit, and he verbalized understanding.

## 2024-08-11 NOTE — Telephone Encounter (Signed)
 Spoke to patient and clarified that he knew that had received cardiac clearance for his upcoming procedure.  Patient acknowledged and understood.

## 2024-08-13 ENCOUNTER — Ambulatory Visit: Admitting: Internal Medicine

## 2024-08-13 ENCOUNTER — Encounter: Payer: Self-pay | Admitting: Internal Medicine

## 2024-08-13 VITALS — BP 94/56 | HR 94 | Temp 98.2°F | Resp 15 | Ht 67.0 in | Wt 178.0 lb

## 2024-08-13 DIAGNOSIS — K219 Gastro-esophageal reflux disease without esophagitis: Secondary | ICD-10-CM

## 2024-08-13 DIAGNOSIS — K2270 Barrett's esophagus without dysplasia: Secondary | ICD-10-CM | POA: Diagnosis not present

## 2024-08-13 DIAGNOSIS — K449 Diaphragmatic hernia without obstruction or gangrene: Secondary | ICD-10-CM | POA: Diagnosis not present

## 2024-08-13 DIAGNOSIS — Z1211 Encounter for screening for malignant neoplasm of colon: Secondary | ICD-10-CM

## 2024-08-13 MED ORDER — SODIUM CHLORIDE 0.9 % IV SOLN: 500.0000 mL | Freq: Once | INTRAVENOUS | Status: DC

## 2024-08-13 NOTE — Op Note (Signed)
 [facility] Patient Name: Anthony Benson Procedure Date: 08/13/2024 8:48 AM MRN: [id number] Endoscopist: Norleen SAILOR. Abran , MD, [id number] Age: 48 Referring MD:  Date of Birth: 1975/12/01 Gender: Male Account #: [id number] Procedure:                Upper GI endoscopy with biopsies Indications:              Esophageal reflux, Surveillance for malignancy due                            to personal history of Barrett's esophagus Medicines:                Monitored Anesthesia Care Procedure:                Pre-Anesthesia Assessment:                           - Prior to the procedure, a History and Physical                            was performed, and patient medications and                            allergies were reviewed. The patient's tolerance of                            previous anesthesia was also reviewed. The risks                            and benefits of the procedure and the sedation                            options and risks were discussed with the patient.                            All questions were answered, and informed consent                            was obtained. Prior Anticoagulants: The patient has                            taken no anticoagulant or antiplatelet agents. ASA                            Grade Assessment: II - A patient with mild systemic                            disease. After reviewing the risks and benefits,                            the patient was deemed in satisfactory condition to                            undergo the procedure.  After obtaining informed consent, the endoscope was                            passed under direct vision. Throughout the                            procedure, the patient's blood pressure, pulse, and                            oxygen saturations were monitored continuously. The                            GIF [id number] #[id number] was introduced through the                             mouth, and advanced to the second part of duodenum.                            The upper GI endoscopy was accomplished without                            difficulty. The patient tolerated the procedure                            well. Scope In: Scope Out: Findings:                 The esophagus revealed Barrett's mucosa in the                            distal portion (C5 M5). The esophagus was examined                            under white light and NBI. No inflammation or focal                            lesions. Multiple biopsies were taken with a cold                            forceps for histology.                           The stomach was normal except for a moderate hiatal                            hernia.                           The examined duodenum was normal.                           The cardia and gastric fundus were normal on                            retroflexion. Complications:  No immediate complications. Estimated Blood Loss:     Estimated blood loss: none. Impression:               1. GERD with Barrett's status post biopsies                           2. Otherwise unremarkable EGD Recommendation:           - Patient has a contact number available for                            emergencies. The signs and symptoms of potential                            delayed complications were discussed with the                            patient. Return to normal activities tomorrow.                            Written discharge instructions were provided to the                            patient.                           - Resume previous diet.                           - Continue present medications.                           - Await pathology results.                           - Repeat EGD in 3 years if no dysplasia on biopsies                           - Routine office follow-up 1 year Lizmary Nader N. Abran, MD 08/13/2024 9:26:36 AM This report has been signed  electronically.

## 2024-08-13 NOTE — Progress Notes (Signed)
 HISTORY OF PRESENT ILLNESS:  Anthony Benson is a 48 y.o. male with GERD complicated by Barrett's esophagus.  He presents today for surveillance upper endoscopy and screening colonoscopy.  REVIEW OF SYSTEMS:  All non-GI ROS negative except for  Past Medical History:  Diagnosis Date   Allergy    Barrett's esophagus    Diabetes (HCC) 01/2023   GERD (gastroesophageal reflux disease)    OTC -prn   Labral tear of shoulder left shoulder   PONV (postoperative nausea and vomiting)    mild    Past Surgical History:  Procedure Laterality Date   ADENOIDECTOMY  1980   KNEE ARTHROSCOPY Left    x 3   SHOULDER ARTHROSCOPY  03/09/2012   Procedure: ARTHROSCOPY SHOULDER;  Surgeon: Reyes JAYSON Billing, MD;  Location: [facility] [place];  Service: Orthopedics;  Laterality: Left;  left shoulder scope exam under anes subarcomial decompression and mini open rotator cuff repair   ower    Social History Vickey Gamarra  reports that he has never smoked. He has never used smokeless tobacco. He reports current alcohol use. He reports that he does not use drugs.  family history includes Barrett's esophagus (age of onset: 100) in his father; Diabetes in his father; Diverticulosis in his father; Heart attack in his father; Hypertension in his father; Multiple myeloma (age of onset: 2) in his mother; Pancreatic cancer (age of onset: 39) in his paternal grandfather.  Allergies  Allergen Reactions   Cefaclor Other (See Comments)    Same reaction as penicillins- anaphylaxis   Other Anaphylaxis    [place] Nuts   Penicillins Anaphylaxis    Has patient had a PCN reaction causing immediate rash, facial/tongue/throat swelling, SOB or lightheadedness with hypotension: Yes  Has patient had a PCN reaction causing severe rash involving mucus membranes or skin necrosis: Yes  Has patient had a PCN reaction that required hospitalization No  Has patient had a PCN reaction occurring within the last 10 years:  No  If all of the above answers are NO, then may proceed with Cephalosporin use.       PHYSICAL EXAMINATION:     ASSESSMENT:  GERD with nondysplastic Barrett's Colorectal cancer screening  PLAN:  1.  EGD 2.  Colonoscopy

## 2024-08-13 NOTE — Op Note (Signed)
 [name] Procedure Date: 08/13/2024 8:49 AM MRN: [id number] Endoscopist: Norleen SAILOR. Abran , MD, [id number] Age: 48 Referring MD:  Date of Birth: 11/03/1975 Gender: Male Account #: [id number] Procedure:                Colonoscopy Indications:              Screening for colorectal malignant neoplasm Medicines:                Monitored Anesthesia Care Procedure:                Pre-Anesthesia Assessment:                           - Prior to the procedure, a History and Physical                            was performed, and patient medications and                            allergies were reviewed. The patient's tolerance of                            previous anesthesia was also reviewed. The risks                            and benefits of the procedure and the sedation                            options and risks were discussed with the patient.                            All questions were answered, and informed consent                            was obtained. Prior Anticoagulants: The patient has                            taken no anticoagulant or antiplatelet agents. ASA                            Grade Assessment: II - A patient with mild systemic                            disease. After reviewing the risks and benefits,                            the patient was deemed in satisfactory condition to                            undergo the procedure.                           After obtaining informed consent, the colonoscope  was passed under direct vision. Throughout the                            procedure, the patient's blood pressure, pulse, and                            oxygen saturations were monitored continuously. The                            CF [id number] #[id number] was introduced through the anus                            and advanced to the the cecum, identified by                            appendiceal  orifice and ileocecal valve. The                            ileocecal valve, appendiceal orifice, and rectum                            were photographed. The quality of the bowel                            preparation was excellent. The colonoscopy was                            performed without difficulty. The patient tolerated                            the procedure well. The bowel preparation used was                            SUPREP via split dose instruction. Scope In: 8:57:14 AM Scope Out: 9:07:40 AM Scope Withdrawal Time: 0 hours 8 minutes 13 seconds  Total Procedure Duration: 0 hours 10 minutes 26 seconds  Findings:                 The entire examined colon appeared normal on direct                            and retroflexion views. Complications:            No immediate complications. Estimated blood loss:                            None. Estimated Blood Loss:     Estimated blood loss: none. Impression:               - The entire examined colon is normal on direct and                            retroflexion views.                           - No  specimens collected. Recommendation:           - Repeat colonoscopy in 10 years for screening                            purposes.                           - Patient has a contact number available for                            emergencies. The signs and symptoms of potential                            delayed complications were discussed with the                            patient. Return to normal activities tomorrow.                            Written discharge instructions were provided to the                            patient.                           - Resume previous diet.                           - Continue present medications. Norleen SAILOR. Abran, MD 08/13/2024 9:10:33 AM This report has been signed electronically.

## 2024-08-13 NOTE — Progress Notes (Signed)
 Vss nad trans to pacu

## 2024-08-13 NOTE — Patient Instructions (Signed)
 Discharge instructions given. Normal colon. Biopsies taken on EGD. Resume previous medications. Handout on Barretts. YOU HAD AN ENDOSCOPIC PROCEDURE TODAY AT THE [facility]:   Refer to the procedure report that was given to you for any specific questions about what was found during the examination.  If the procedure report does not answer your questions, please call your gastroenterologist to clarify.  If you requested that your care partner not be given the details of your procedure findings, then the procedure report has been included in a sealed envelope for you to review at your convenience later.  YOU SHOULD EXPECT: Some feelings of bloating in the abdomen. Passage of more gas than usual.  Walking can help get rid of the air that was put into your GI tract during the procedure and reduce the bloating. If you had a lower endoscopy (such as a colonoscopy or flexible sigmoidoscopy) you may notice spotting of blood in your stool or on the toilet paper. If you underwent a bowel prep for your procedure, you may not have a normal bowel movement for a few days.  Please Note:  You might notice some irritation and congestion in your nose or some drainage.  This is from the oxygen used during your procedure.  There is no need for concern and it should clear up in a day or so.  SYMPTOMS TO REPORT IMMEDIATELY:  Following lower endoscopy (colonoscopy or flexible sigmoidoscopy):  Excessive amounts of blood in the stool  Significant tenderness or worsening of abdominal pains  Swelling of the abdomen that is new, acute  Fever of 100F or higher  Following upper endoscopy (EGD)  Vomiting of blood or coffee ground material  New chest pain or pain under the shoulder blades  Painful or persistently difficult swallowing  New shortness of breath  Fever of 100F or higher  Black, tarry-looking stools  For urgent or emergent issues, a gastroenterologist can be reached at any hour by calling  [phone]. Do not use MyChart messaging for urgent concerns.    DIET:  We do recommend a small meal at first, but then you may proceed to your regular diet.  Drink plenty of fluids but you should avoid alcoholic beverages for 24 hours.  ACTIVITY:  You should plan to take it easy for the rest of today and you should NOT DRIVE or use heavy machinery until tomorrow (because of the sedation medicines used during the test).    FOLLOW UP: Our staff will call the number listed on your records the next business day following your procedure.  We will call around 7:15- 8:00 am to check on you and address any questions or concerns that you may have regarding the information given to you following your procedure. If we do not reach you, we will leave a message.     If any biopsies were taken you will be contacted by phone or by letter within the next 1-3 weeks.  Please call us  at [phone] if you have not heard about the biopsies in 3 weeks.    SIGNATURES/CONFIDENTIALITY: You and/or your care partner have signed paperwork which will be entered into your electronic medical record.  These signatures attest to the fact that that the information above on your After Visit Summary has been reviewed and is understood.  Full responsibility of the confidentiality of this discharge information lies with you and/or your care-partner.

## 2024-08-13 NOTE — Progress Notes (Signed)
 Pt's states no medical or surgical changes since previsit or office visit.

## 2024-08-13 NOTE — Progress Notes (Signed)
 Called to room to assist during endoscopic procedure.  Patient ID and intended procedure confirmed with present staff. Received instructions for my participation in the procedure from the performing physician.

## 2024-08-16 ENCOUNTER — Telehealth: Payer: Self-pay | Admitting: *Deleted

## 2024-08-16 NOTE — Telephone Encounter (Signed)
  Follow up Call-     08/13/2024    7:47 AM  Call back number  Post procedure Call Back phone  # [phone]  Permission to leave phone message Yes     Patient questions:  Do you have a fever, pain , or abdominal swelling? No. Pain Score  0 *  Have you tolerated food without any problems? Yes.    Have you been able to return to your normal activities? Yes.    Do you have any questions about your discharge instructions: Diet   No. Medications  No. Follow up visit  No.  Do you have questions or concerns about your Care? No.  Actions: * If pain score is 4 or above: No action needed, pain <4.

## 2024-08-17 ENCOUNTER — Ambulatory Visit: Payer: Self-pay | Admitting: Internal Medicine

## 2024-08-17 LAB — SURGICAL PATHOLOGY

## 2024-08-17 NOTE — Telephone Encounter (Signed)
 Colonoscopy completed. Closing note.

## 2024-08-21 LAB — HEPATIC FUNCTION PANEL
ALT: 85 IU/L — ABNORMAL HIGH (ref 0–44)
AST: 54 IU/L — ABNORMAL HIGH (ref 0–40)
Albumin: 4.7 g/dL (ref 4.1–5.1)
Alkaline Phosphatase: 56 IU/L (ref 47–123)
Bilirubin Total: 0.7 mg/dL (ref 0.0–1.2)
Bilirubin, Direct: 0.2 mg/dL (ref 0.00–0.40)
Total Protein: 7.4 g/dL (ref 6.0–8.5)

## 2024-08-21 LAB — LIPID PANEL
Chol/HDL Ratio: 2.6 ratio (ref 0.0–5.0)
Cholesterol, Total: 98 mg/dL — ABNORMAL LOW (ref 100–199)
HDL: 37 mg/dL — ABNORMAL LOW (ref >39)
LDL Chol Calc (NIH): 48 mg/dL (ref 0–99)
Triglycerides: 52 mg/dL (ref 0–149)
VLDL Cholesterol Cal: 13 mg/dL (ref 5–40)

## 2024-08-24 DIAGNOSIS — I2510 Atherosclerotic heart disease of native coronary artery without angina pectoris: Secondary | ICD-10-CM | POA: Insufficient documentation

## 2024-08-24 NOTE — Assessment & Plan Note (Signed)
 Pravastatin  switched to atorvastatin  40 mg daily.  Goal LDL is <55.***

## 2024-08-24 NOTE — Assessment & Plan Note (Signed)
 Recent coronary CTA with moderate disease with soft plaque.  Distal LCx lesion appeared to be hemodynamically significant by FFR.  As noted, this was reviewed with Dr. Barbaraann who felt that this is a small vessel with narrowing of borderline significance.  Given this the distal nature of disease, medical therapy has been recommended.***

## 2024-08-24 NOTE — Progress Notes (Unsigned)
 OFFICE NOTE:    Date:  08/25/2024  ID:  Camellia Wisener, DOB 1976/07/20, MRN [id number] PCP: Chandra Toribio POUR, MD  [facility] Providers Cardiologist:  None        Coronary artery disease  TTE 08/05/2024: EF 55-60, no RWMA, normal RVSF, normal PASP, trivial MR CCTA 07/09/2024: CAC score 0 (1st percentile); mid LAD soft plaque 25-49; distal LCx 50-70 soft plaque; proximal RCA soft plaque 25-49; distal LCx FFR 0.79 (hemodynamically significant)>> med Rx given distal disease Diabetes mellitus type 2 Hyperlipidemia  GERD / Barrett's esophagus FHx of CAD       Discussed the use of AI scribe software for clinical note transcription with the patient, who gave verbal consent to proceed. History of Present Illness Anthony Benson is a 48 y.o. male who returns for follow up of chest pain. He was seen by Damien Braver, NP 06/30/24 for evaluation of chest pain after visit to the ED. He was set up for TTE, CCTA.  Echocardiogram demonstrated normal ejection fraction.  CT demonstrated moderate CAD with hemodynamically significant stenosis in the distal LCx by FFR.  According to the notes, this was reviewed with Dr. Barbaraann who felt that it is a small vessel with narrowing of borderline significance.  Medical therapy was recommended.  Cardiac catheterization could be considered for refractory angina.  He was placed on aspirin and pravastatin  was switched to atorvastatin .  He experiences occasional left-sided chest tightness described as 'a tiny bit of tugging' that is not long-lasting and occurs randomly, sometimes after driving or while sitting at work. No associated shortness of breath, sweating, or symptoms during physical activities such as walking, dragging a deer, mowing, or raking leaves. He walks 15-20 minutes every night without issues. 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 He does not smoke and has had alcohol twice in the past year and a half.    ROS-See HPI    Studies Reviewed:      Results LABS HDL: 37 (08/20/2024) LDL: 48 (08/20/2024) AST: 54 (08/20/2024) ALT: 85 (08/20/2024) Total cholesterol: 98 (08/20/2024) Triglycerides: 52 (08/20/2024) Lipoprotein A: 10 (08/20/2024)         Physical Exam:  VS:  BP 120/80   Pulse 93   Ht 5' 7 (1.702 m)   Wt 178 lb 3.2 oz (80.8 kg)   SpO2 98%   BMI 27.91 kg/m        Wt Readings from Last 3 Encounters:  08/25/24 178 lb 3.2 oz (80.8 kg)  08/13/24 178 lb (80.7 kg)  08/02/24 178 lb (80.7 kg)    Constitutional:      Appearance: Healthy appearance. Not in distress.  Neck:     Vascular: JVD normal.  Pulmonary:     Breath sounds: Normal breath sounds. No wheezing. No rales.  Cardiovascular:     Normal rate. Regular rhythm.     Murmurs: There is no murmur.  Edema:    Peripheral edema absent.  Abdominal:     Palpations: Abdomen is soft.       Assessment and Plan:    Assessment & Plan Coronary artery disease involving native coronary artery of native heart with angina pectoris Recent coronary CTA with moderate disease with soft plaque.  Distal LCx lesion appeared to be hemodynamically significant by FFR.  As noted, this was reviewed with Dr. Barbaraann who felt that this  is a small vessel with narrowing of borderline significance.  Given this the distal nature of disease, medical therapy has been recommended. No significant consistent symptoms of angina. Intermittent left-sided tightness, not associated with exertion or reproducible symptoms. Question if these are MSK symptoms. We discussed medical Rx to include low dose beta-blocker with metoprolol . He prefers to hold off on this for now. Given his current symptoms, I think this is reasonable.  - Continue aspirin 81 mg daily - Continue Atorvastatin  40 mg once daily  - Prescribe nitroglycerin  for use as needed - Follow up with Dr. Barbaraann in  6 months Hyperlipidemia associated with type 2 diabetes mellitus (HCC) Elevated LFTs Pravastatin  switched to atorvastatin  40 mg daily.  Goal LDL is <55. LDL is optimal at 48 mg/dL. Lipoprotein A level is low. Recent liver function tests show elevated AST at 54 and ALT at 85.   - Continue atorvastatin  40 mg daily - Repeat liver function tests in 2-3 weeks - If liver enzymes remain elevated, refer to gastroenterologist for evaluation of possible fatty liver - If LFTs remain elevated consider switching Atorvastatin  to Rosuvastatin          Dispo:  Return in about 6 months (around 02/23/2025) for Routine Follow Up w/ Dr. Barbaraann.  Signed, Glendia Ferrier, PA-C

## 2024-08-25 ENCOUNTER — Encounter: Payer: Self-pay | Admitting: Physician Assistant

## 2024-08-25 ENCOUNTER — Ambulatory Visit: Admitting: Nurse Practitioner

## 2024-08-25 ENCOUNTER — Ambulatory Visit: Attending: Physician Assistant | Admitting: Physician Assistant

## 2024-08-25 VITALS — BP 120/80 | HR 93 | Ht 67.0 in | Wt 178.2 lb

## 2024-08-25 DIAGNOSIS — E785 Hyperlipidemia, unspecified: Secondary | ICD-10-CM

## 2024-08-25 DIAGNOSIS — R7989 Other specified abnormal findings of blood chemistry: Secondary | ICD-10-CM | POA: Diagnosis not present

## 2024-08-25 DIAGNOSIS — E1169 Type 2 diabetes mellitus with other specified complication: Secondary | ICD-10-CM

## 2024-08-25 DIAGNOSIS — I25119 Atherosclerotic heart disease of native coronary artery with unspecified angina pectoris: Secondary | ICD-10-CM | POA: Diagnosis not present

## 2024-08-25 MED ORDER — NITROGLYCERIN 0.4 MG SL SUBL: 0.4000 mg | SUBLINGUAL_TABLET | SUBLINGUAL | 11 refills | Status: AC | PRN

## 2024-08-25 NOTE — Patient Instructions (Addendum)
 Medication Instructions:  Your physician has recommended you make the following change in your medication:  Nitroglycerin  0.4 mg as needed for chest pain. Dissolve one under tongue for chest pain every 5 minutes up to 3 doses. If no relief, proceed to ED or call 911. Continue to take all other medications as prescribed  Labwork: Labs to be completed in 2 weeks at our [facility]  Testing/Procedures: None  Follow-Up: Your physician recommends that you schedule a follow-up appointment in: 6 months  Any Other Special Instructions Will Be Listed Below (If Applicable). Thank you for choosing [facility]!     If you need a refill on your cardiac medications before your next appointment, please call your pharmacy.

## 2024-09-10 DIAGNOSIS — R7989 Other specified abnormal findings of blood chemistry: Secondary | ICD-10-CM | POA: Diagnosis not present

## 2024-09-10 DIAGNOSIS — I25119 Atherosclerotic heart disease of native coronary artery with unspecified angina pectoris: Secondary | ICD-10-CM | POA: Diagnosis not present

## 2024-09-10 DIAGNOSIS — E1169 Type 2 diabetes mellitus with other specified complication: Secondary | ICD-10-CM | POA: Diagnosis not present

## 2024-09-10 DIAGNOSIS — E785 Hyperlipidemia, unspecified: Secondary | ICD-10-CM | POA: Diagnosis not present

## 2024-09-11 LAB — HEPATIC FUNCTION PANEL
ALT: 46 IU/L — ABNORMAL HIGH (ref 0–44)
AST: 43 IU/L — ABNORMAL HIGH (ref 0–40)
Albumin: 4.7 g/dL (ref 4.1–5.1)
Alkaline Phosphatase: 58 IU/L (ref 47–123)
Bilirubin Total: 0.4 mg/dL (ref 0.0–1.2)
Bilirubin, Direct: <0.08 mg/dL (ref 0.00–0.40)
Total Protein: 7.3 g/dL (ref 6.0–8.5)

## 2024-09-12 ENCOUNTER — Ambulatory Visit: Payer: Self-pay | Admitting: Physician Assistant

## 2024-09-12 DIAGNOSIS — E1169 Type 2 diabetes mellitus with other specified complication: Secondary | ICD-10-CM

## 2024-09-12 DIAGNOSIS — I25119 Atherosclerotic heart disease of native coronary artery with unspecified angina pectoris: Secondary | ICD-10-CM

## 2024-09-12 DIAGNOSIS — R7989 Other specified abnormal findings of blood chemistry: Secondary | ICD-10-CM

## 2024-09-16 MED ORDER — ROSUVASTATIN CALCIUM 40 MG PO TABS: 40.0000 mg | ORAL_TABLET | Freq: Every day | ORAL | 3 refills | Status: AC

## 2024-09-21 ENCOUNTER — Ambulatory Visit (INDEPENDENT_AMBULATORY_CARE_PROVIDER_SITE_OTHER): Admitting: Family Medicine

## 2024-09-21 VITALS — BP 124/88 | HR 82 | Ht 67.0 in | Wt 178.1 lb

## 2024-09-21 DIAGNOSIS — E1169 Type 2 diabetes mellitus with other specified complication: Secondary | ICD-10-CM

## 2024-09-21 DIAGNOSIS — Z125 Encounter for screening for malignant neoplasm of prostate: Secondary | ICD-10-CM | POA: Diagnosis not present

## 2024-09-21 DIAGNOSIS — E119 Type 2 diabetes mellitus without complications: Secondary | ICD-10-CM

## 2024-09-21 DIAGNOSIS — E785 Hyperlipidemia, unspecified: Secondary | ICD-10-CM

## 2024-09-21 LAB — POCT GLYCOSYLATED HEMOGLOBIN (HGB A1C): HbA1c POC (<> result, manual entry): 6.5 % (ref 4.0–5.6)

## 2024-09-21 NOTE — Patient Instructions (Signed)
 It was nice to see you today,  We addressed the following topics today: - I will request the records from your eye exam with Dr. Patrcia. - We will schedule your annual physical for February, at which time we will repeat your blood work. - Please continue monitoring for any side effects from the new medication.  Have a great day,  Rolan Slain, MD

## 2024-09-21 NOTE — Assessment & Plan Note (Signed)
 Lipid panel was at goal on atorvastatin . However, developed elevated hepatic enzymes (ALT/AST). This is a known, often transient, side effect of statins and is rarely harmful at low levels of elevation. Discussed switching to an alternative statin. - Switch from atorvastatin  40 mg to Crestor  (rosuvastatin ) 20 mg. - Abdominal ultrasound scheduled for 11/16/2024 to evaluate for hepatic steatosis (fatty liver). Discussed non-alcoholic fatty liver disease (NAFLD) and its progression. - Repeat labs with next physical.

## 2024-09-21 NOTE — Progress Notes (Unsigned)
 Established Patient Office Visit  Subjective   Patient ID: Anthony Benson, male    DOB: 1976/09/07  Age: 48 y.o. MRN: [id number]  Chief Complaint  Patient presents with   Medical Management of Chronic Issues    HPI  Dr. Patrcia morita optho.    Subjective - Follow-up for hyperlipidemia and diabetes. No new issues reported since last visit. - Recent endoscopy and colonoscopy were unremarkable. - Cardiology evaluation complete. No new significant chest pain, reports some tightness but believes it is muscular. - Reports elevated liver enzymes on recent labs after starting atorvastatin . Was advised this can be a side effect of generic Lipitor. Is switching to Crestor  (rosuvastatin ). - Reports concern about PFAS/PFOS contamination in the well water in [place]. The fire station, elementary school, and a nursing home have tested high. Their house is near these locations. Will be using bottled water temporarily and will have well water tested.  Medications: Atorvastatin  40 mg, will be switching to Crestor  (rosuvastatin ) 20 mg. Nitroglycerin  as needed for chest pain, prescribed by cardiology.  PMH, PSH, FH, Social Hx: PMHx: Hyperlipidemia, type 2 diabetes. PSH: Endoscopy, colonoscopy (recent, unremarkable). Social Hx: Resides in [place], uses well water with a softening system and additional filters. Concerned about local water contamination. Does not drink the well water, uses bottled water.  ROS: Cardiovascular: Denies significant chest pain. Reports intermittent chest tightness, attributed to muscular origin. Constitutional: Denies new issues.   The ASCVD Risk score (Arnett DK, et al., 2019) failed to calculate for the following reasons:   The valid total cholesterol range is 130 to 320 mg/dL  Health Maintenance Due  Topic Date Due   OPHTHALMOLOGY EXAM  Never done   HIV Screening  Never done   Hepatitis C Screening  Never done   Pneumococcal Vaccine: 50+ Years  (1 of 2 - PCV) Never done   Hepatitis B Vaccines 19-59 Average Risk (1 of 3 - 19+ 3-dose series) Never done   Zoster Vaccines- Shingrix (1 of 2) Never done   COVID-19 Vaccine (3 - Pfizer risk series) 02/25/2020      Objective:     BP 124/88   Pulse 82   Ht 5' 7 (1.702 m)   Wt 178 lb 1.9 oz (80.8 kg)   SpO2 98%   BMI 27.90 kg/m  {Vitals History (Optional):23777}  Physical Exam   Results for orders placed or performed in visit on 09/21/24  POCT glycosylated hemoglobin (Hb A1C)  Result Value Ref Range   Hemoglobin A1C     HbA1c POC (<> result, manual entry) 6.5 4.0 - 5.6 %   HbA1c, POC (prediabetic range)     HbA1c, POC (controlled diabetic range)          Assessment & Plan:   Type 2 diabetes mellitus without complication, without long-term current use of insulin  (HCC) Assessment & Plan: Most recent A1c was 6.5. Stable. - Continue current management. - Annual eye exam scheduled for tomorrow, 09/22/2024, with Dr. Patrcia. Will request records. - Will perform annual physical with full blood panel in February.  Orders: -     POCT glycosylated hemoglobin (Hb A1C)  Hyperlipidemia associated with type 2 diabetes mellitus (HCC) Assessment & Plan: Lipid panel was at goal on atorvastatin . However, developed elevated hepatic enzymes (ALT/AST). This is a known, often transient, side effect of statins and is rarely harmful at low levels of elevation. Discussed switching to an alternative statin. - Switch from atorvastatin  40 mg to Crestor  (rosuvastatin ) 20 mg. -  Abdominal ultrasound scheduled for 11/16/2024 to evaluate for hepatic steatosis (fatty liver). Discussed non-alcoholic fatty liver disease (NAFLD) and its progression. - Repeat labs with next physical.      Return in about 3 months (around 12/22/2024) for physical.    Toribio [name], MD

## 2024-09-21 NOTE — Assessment & Plan Note (Signed)
 Most recent A1c was 6.5. Stable. - Continue current management. - Annual eye exam scheduled for tomorrow, 09/22/2024, with Dr. Patrcia. Will request records. - Will perform annual physical with full blood panel in February.

## 2024-09-22 DIAGNOSIS — E119 Type 2 diabetes mellitus without complications: Secondary | ICD-10-CM | POA: Diagnosis not present

## 2024-09-22 DIAGNOSIS — H52203 Unspecified astigmatism, bilateral: Secondary | ICD-10-CM | POA: Diagnosis not present

## 2024-09-22 DIAGNOSIS — H524 Presbyopia: Secondary | ICD-10-CM | POA: Diagnosis not present

## 2024-09-22 LAB — OPHTHALMOLOGY REPORT-SCANNED

## 2024-10-06 ENCOUNTER — Other Ambulatory Visit: Payer: Self-pay | Admitting: Family Medicine

## 2024-11-05 LAB — HEPATIC FUNCTION PANEL
ALT: 22 IU/L (ref 0–44)
AST: 21 IU/L (ref 0–40)
Albumin: 4.5 g/dL (ref 4.1–5.1)
Alkaline Phosphatase: 56 IU/L (ref 47–123)
Bilirubin Total: 0.5 mg/dL (ref 0.0–1.2)
Bilirubin, Direct: 0.14 mg/dL (ref 0.00–0.40)
Total Protein: 7 g/dL (ref 6.0–8.5)

## 2024-11-05 LAB — LIPID PANEL
Chol/HDL Ratio: 2.8 ratio (ref 0.0–5.0)
Cholesterol, Total: 92 mg/dL — ABNORMAL LOW (ref 100–199)
HDL: 33 mg/dL — ABNORMAL LOW
LDL Chol Calc (NIH): 47 mg/dL (ref 0–99)
Triglycerides: 46 mg/dL (ref 0–149)
VLDL Cholesterol Cal: 12 mg/dL (ref 5–40)

## 2024-11-16 ENCOUNTER — Ambulatory Visit: Admitting: Internal Medicine

## 2024-11-30 ENCOUNTER — Other Ambulatory Visit: Payer: Self-pay | Admitting: Family Medicine

## 2024-11-30 DIAGNOSIS — E119 Type 2 diabetes mellitus without complications: Secondary | ICD-10-CM

## 2024-12-24 ENCOUNTER — Other Ambulatory Visit

## 2024-12-31 ENCOUNTER — Encounter: Admitting: Family Medicine
